# Patient Record
Sex: Female | Born: 1955 | Race: White | Hispanic: No | Marital: Married | Smoking: Never smoker
Health system: Southern US, Community
[De-identification: ages and names within clinical notes are randomized; demographics above are authoritative.]

## PROBLEM LIST (undated history)

## (undated) DIAGNOSIS — Z8669 Personal history of other diseases of the nervous system and sense organs: Secondary | ICD-10-CM

## (undated) DIAGNOSIS — R9431 Abnormal electrocardiogram [ECG] [EKG]: Secondary | ICD-10-CM

## (undated) DIAGNOSIS — R9430 Abnormal result of cardiovascular function study, unspecified: Secondary | ICD-10-CM

## (undated) DIAGNOSIS — R0989 Other specified symptoms and signs involving the circulatory and respiratory systems: Secondary | ICD-10-CM

## (undated) DIAGNOSIS — IMO0002 Reserved for concepts with insufficient information to code with codable children: Secondary | ICD-10-CM

## (undated) DIAGNOSIS — Z8619 Personal history of other infectious and parasitic diseases: Secondary | ICD-10-CM

## (undated) DIAGNOSIS — I4891 Unspecified atrial fibrillation: Secondary | ICD-10-CM

## (undated) DIAGNOSIS — I499 Cardiac arrhythmia, unspecified: Secondary | ICD-10-CM

## (undated) DIAGNOSIS — C439 Malignant melanoma of skin, unspecified: Secondary | ICD-10-CM

## (undated) DIAGNOSIS — D689 Coagulation defect, unspecified: Secondary | ICD-10-CM

## (undated) DIAGNOSIS — N390 Urinary tract infection, site not specified: Secondary | ICD-10-CM

## (undated) HISTORY — DX: Malignant melanoma of skin, unspecified: C43.9

## (undated) HISTORY — DX: Personal history of other diseases of the nervous system and sense organs: Z86.69

## (undated) HISTORY — DX: Personal history of other infectious and parasitic diseases: Z86.19

## (undated) HISTORY — DX: Abnormal result of cardiovascular function study, unspecified: R94.30

## (undated) HISTORY — DX: Cardiac arrhythmia, unspecified: I49.9

## (undated) HISTORY — DX: Coagulation defect, unspecified: D68.9

## (undated) HISTORY — DX: Abnormal electrocardiogram (ECG) (EKG): R94.31

## (undated) HISTORY — DX: Reserved for concepts with insufficient information to code with codable children: IMO0002

## (undated) HISTORY — DX: Urinary tract infection, site not specified: N39.0

## (undated) HISTORY — DX: Unspecified atrial fibrillation: I48.91

## (undated) HISTORY — DX: Other specified symptoms and signs involving the circulatory and respiratory systems: R09.89

---

## 2010-12-23 HISTORY — PX: MELANOMA EXCISION: SHX5266

## 2011-04-23 LAB — HM PAP SMEAR: HM Pap smear: NORMAL

## 2012-05-05 ENCOUNTER — Encounter: Payer: Self-pay | Admitting: Family

## 2012-05-05 ENCOUNTER — Ambulatory Visit (INDEPENDENT_AMBULATORY_CARE_PROVIDER_SITE_OTHER): Payer: BC Managed Care – PPO | Admitting: Family

## 2012-05-05 VITALS — BP 130/96 | HR 74 | Temp 97.4°F | Resp 16 | Ht 68.5 in | Wt 137.0 lb

## 2012-05-05 DIAGNOSIS — Z85820 Personal history of malignant melanoma of skin: Secondary | ICD-10-CM

## 2012-05-05 DIAGNOSIS — R300 Dysuria: Secondary | ICD-10-CM

## 2012-05-05 DIAGNOSIS — N390 Urinary tract infection, site not specified: Secondary | ICD-10-CM | POA: Insufficient documentation

## 2012-05-05 DIAGNOSIS — M949 Disorder of cartilage, unspecified: Secondary | ICD-10-CM

## 2012-05-05 DIAGNOSIS — M899 Disorder of bone, unspecified: Secondary | ICD-10-CM

## 2012-05-05 DIAGNOSIS — M8580 Other specified disorders of bone density and structure, unspecified site: Secondary | ICD-10-CM | POA: Insufficient documentation

## 2012-05-05 DIAGNOSIS — Z86006 Personal history of melanoma in-situ: Secondary | ICD-10-CM | POA: Insufficient documentation

## 2012-05-05 LAB — POCT URINALYSIS DIPSTICK
Bilirubin, UA: NEGATIVE
Glucose, UA: NEGATIVE
Ketones, UA: NEGATIVE
Spec Grav, UA: 1.015
Urobilinogen, UA: 0.2

## 2012-05-05 MED ORDER — CIPROFLOXACIN HCL 500 MG PO TABS: 500.0000 mg | ORAL_TABLET | Freq: Two times a day (BID) | ORAL | Status: DC

## 2012-05-05 NOTE — Assessment & Plan Note (Signed)
UA is reviewed and is + for blood and leukocytes suggesting UTI. Will send urine for culture and will plan to treat with Cipro.

## 2012-05-05 NOTE — Assessment & Plan Note (Signed)
Will request old records including dexa.  Continue caltrate/vitamin D, exercise.

## 2012-05-05 NOTE — Patient Instructions (Signed)
Please schedule fasting physical at the front desk. Call if your urinary symptoms worsen or if no improvement in 2-3 days.

## 2012-05-05 NOTE — Assessment & Plan Note (Signed)
She is followed by dermatology (Draelos).

## 2012-05-05 NOTE — Progress Notes (Signed)
Subjective:    Patient ID: Debra Murphy, female    DOB: June 14, 1956, 56 y.o.   MRN: [id number]  HPI  Debra Murphy is a 56 yr old female who presents today to establish care.  She presents with chief complaint of dysuria.  1) Dysuria- Symptoms started Sunday night.  Denies fever.  Small blood clot this AM.  Energy feels fine.   2) Melanoma- Spot removed last year 2012 (in situ) per pt.  Sees Zoe Draelos.  She sees her every 6 months.   3) Osteopenia- she reports last dexa was 5/12 and noted osteopenia.  She is maintained on caltrate and vitamin D.   Review of Systems  Constitutional: Negative for unexpected weight change.  HENT: Negative for hearing loss.   Eyes: Negative for visual disturbance.  Respiratory: Negative for shortness of breath.   Cardiovascular: Negative for chest pain and leg swelling.  Gastrointestinal: Negative for nausea, vomiting and diarrhea.  Genitourinary: Positive for dysuria and hematuria.  Musculoskeletal: Negative for myalgias and arthralgias.  Skin: Negative for pallor.  Neurological: Negative for headaches.  Hematological: Negative for adenopathy.  Psychiatric/Behavioral:       Denies depression/anxiety   Past Medical History  Diagnosis Date  . History of chicken pox   . History of migraine     rare migraines    History   Social History  . Marital Status: Married    Spouse Name: N/A    Number of Children: 2  . Years of Education: N/A   Occupational History  . Not on file.   Social History Main Topics  . Smoking status: Never Smoker   . Smokeless tobacco: Never Used  . Alcohol Use: Yes     social  . Drug Use: Not on file  . Sexually Active: Not on file   Other Topics Concern  . Not on file   Social History Narrative   Regular exercise:  Walks 3 x weeklyNon smokerLooking for work- worked at [employer] over [employer].  Volunteers at [employer] theater and [employer]oreComplete collegeMarried for 58 yrs2 daughters- one in [place] and [place]- no  grandchildrenExtended family on 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      Past Surgical History  Procedure Date  . Melanoma excision 2012    melanoma remove from neck    Family History  Problem Relation Age of Onset  . Arthritis Mother   . Heart disease Mother     valve replacement  . Cancer Father     lung, bone, died at 15  . Heart attack Father     in his 57's  . Diabetes Neg Hx   . Stroke Neg Hx     No Known Allergies  Current Outpatient Prescriptions on File Prior to Visit  Medication Sig Dispense Refill  . Calcium Carb-Cholecalciferol (CALCIUM 1000 + D PO) Take 1 tablet by mouth 2 (two) times daily.        BP 130/96  Pulse 74  Temp(Src) 97.4 F (36.3 C) (Oral)  Resp 16  Ht 5' 8.5" (1.74 m)  Wt 137 lb (62.143 kg)  BMI 20.53 kg/m2  SpO2 94%  LMP 10/05/2009       Objective:   Physical Exam  Constitutional: She is oriented to person, place, and time. She appears well-developed and well-nourished. No distress.  HENT:  Head: Normocephalic.  Right Ear: Tympanic membrane and ear canal normal.  Left Ear: Tympanic membrane and ear canal normal.  Mouth/Throat: No posterior oropharyngeal edema or posterior oropharyngeal erythema.  Cardiovascular: Normal rate and regular rhythm.   No murmur heard. Pulmonary/Chest: Effort normal and breath sounds normal. No respiratory distress. She has no wheezes. She has no rales. She exhibits no tenderness.  Abdominal: Soft. Bowel sounds are normal. She exhibits no distension.  Genitourinary:       Neg CVAT  Musculoskeletal: She exhibits no edema.  Neurological: She is alert and oriented to person, place, and time.  Skin: Skin is warm and dry.  Psychiatric: She has a normal mood and affect. Her behavior is normal. Judgment and thought content normal.          Assessment & Plan:

## 2012-05-08 LAB — URINE CULTURE: Colony Count: 35000

## 2012-05-13 ENCOUNTER — Encounter: Payer: Self-pay | Admitting: Cardiology

## 2012-05-13 ENCOUNTER — Other Ambulatory Visit: Payer: Self-pay | Admitting: Family

## 2012-05-13 ENCOUNTER — Other Ambulatory Visit (HOSPITAL_COMMUNITY)
Admission: RE | Admit: 2012-05-13 | Discharge: 2012-05-13 | Disposition: A | Discharge status: Home / Self Care | Payer: BC Managed Care – PPO | Source: Ambulatory Visit | Attending: Family | Admitting: Family

## 2012-05-13 ENCOUNTER — Ambulatory Visit (INDEPENDENT_AMBULATORY_CARE_PROVIDER_SITE_OTHER): Payer: BC Managed Care – PPO | Admitting: Cardiology

## 2012-05-13 ENCOUNTER — Encounter: Payer: Self-pay | Admitting: Family

## 2012-05-13 ENCOUNTER — Ambulatory Visit (INDEPENDENT_AMBULATORY_CARE_PROVIDER_SITE_OTHER): Payer: BC Managed Care – PPO | Admitting: Family

## 2012-05-13 ENCOUNTER — Encounter: Payer: Self-pay | Admitting: Gastroenterology

## 2012-05-13 VITALS — BP 120/90 | HR 84 | Ht 69.0 in | Wt 136.1 lb

## 2012-05-13 DIAGNOSIS — Z01419 Encounter for gynecological examination (general) (routine) without abnormal findings: Secondary | ICD-10-CM | POA: Insufficient documentation

## 2012-05-13 DIAGNOSIS — I4891 Unspecified atrial fibrillation: Secondary | ICD-10-CM

## 2012-05-13 DIAGNOSIS — Z1231 Encounter for screening mammogram for malignant neoplasm of breast: Secondary | ICD-10-CM

## 2012-05-13 DIAGNOSIS — R9431 Abnormal electrocardiogram [ECG] [EKG]: Secondary | ICD-10-CM | POA: Insufficient documentation

## 2012-05-13 DIAGNOSIS — Z0000 Encounter for general adult medical examination without abnormal findings: Secondary | ICD-10-CM | POA: Insufficient documentation

## 2012-05-13 DIAGNOSIS — IMO0002 Reserved for concepts with insufficient information to code with codable children: Secondary | ICD-10-CM

## 2012-05-13 LAB — CBC WITH DIFFERENTIAL/PLATELET
Eosinophils Absolute: 0.2 10*3/uL (ref 0.0–0.7)
Hemoglobin: 13.6 g/dL (ref 12.0–15.0)
Lymphs Abs: 1.6 10*3/uL (ref 0.7–4.0)
MCH: 30.6 pg (ref 26.0–34.0)
Monocytes Relative: 6 % (ref 3–12)
Neutro Abs: 3.5 10*3/uL (ref 1.7–7.7)
Neutrophils Relative %: 61 % (ref 43–77)
Platelets: 162 10*3/uL (ref 150–400)
RBC: 4.44 MIL/uL (ref 3.87–5.11)
WBC: 5.7 10*3/uL (ref 4.0–10.5)

## 2012-05-13 LAB — LIPID PANEL
Cholesterol: 166 mg/dL (ref 0–200)
LDL Cholesterol: 85 mg/dL (ref 0–99)
Triglycerides: 71 mg/dL (ref <150)

## 2012-05-13 LAB — BASIC METABOLIC PANEL WITH GFR
CO2: 30 mEq/L (ref 19–32)
Calcium: 9.6 mg/dL (ref 8.4–10.5)
GFR, Est African American: >89 mL/min
Glucose, Bld: 82 mg/dL (ref 70–99)
Potassium: 4 mEq/L (ref 3.5–5.3)
Sodium: 146 mEq/L — ABNORMAL HIGH (ref 135–145)

## 2012-05-13 LAB — HEPATIC FUNCTION PANEL
ALT: 27 U/L (ref 0–35)
Alkaline Phosphatase: 116 U/L (ref 39–117)
Bilirubin, Direct: 0.2 mg/dL (ref 0.0–0.3)
Indirect Bilirubin: 0.5 mg/dL (ref 0.0–0.9)
Total Protein: 6.9 g/dL (ref 6.0–8.3)

## 2012-05-13 MED ORDER — ASPIRIN EC 81 MG PO TBEC: 81.0000 mg | DELAYED_RELEASE_TABLET | Freq: Every day | ORAL | Status: AC

## 2012-05-13 MED ORDER — DILTIAZEM HCL ER COATED BEADS 120 MG PO TB24: 120.0000 mg | ORAL_TABLET | Freq: Every day | ORAL | Status: DC

## 2012-05-13 NOTE — Assessment & Plan Note (Signed)
New, asymptomatic.  Rate 116 on EKG.  Case reviewed with Dr. Rodena Medin.  Will start cardizem for rate control (per pharm request rx changed from cardizem LA to CD due to lower co-pay).  Add aspirin, refer for cardiology for further evaluation.  Defer anticoagulation to cardiology.

## 2012-05-13 NOTE — Progress Notes (Signed)
Subjective:    Patient ID: Debra Murphy, female    DOB: [birth year], 56 y.o.   MRN: [id number]  HPI  Ms.  Murphy is a 56 yr old female who presents today for CPX.   Last mammogram 2011, last pap smear and DEXA 04/2011. Last tetanus 2009. Never had colonoscopy. Diet is healthy.  Regular exercise.  Tetanus is up to date.  Walking with neighbor.   Review of Systems  Constitutional: Negative for unexpected weight change.  HENT: Negative for hearing loss and congestion.   Eyes: Negative for visual disturbance.  Respiratory: Negative for cough.   Cardiovascular: Negative for chest pain.  Gastrointestinal: Negative for nausea, vomiting and diarrhea.  Genitourinary: Negative for dysuria, frequency and hematuria.  Musculoskeletal: Negative for myalgias and arthralgias.  Skin: Negative for rash.  Neurological: Negative for headaches.  Hematological: Negative for adenopathy.  Psychiatric/Behavioral:       Reports good mood.     Past Medical History  Diagnosis Date  . History of chicken pox   . History of migraine     rare migraines    History   Social History  . Marital Status: Married    Spouse Name: Debra Murphy    Number of Children: 2  . Years of Education: [place]bra [place]   Occupational History  . Not on file.   Social History Main Topics  . Smoking status: Never Smoker   . Smokeless tobacco: Never Used  . Alcohol Use: Yes     social  . Drug Use: Not on file  . Sexually Active: Not on file   Other Topics Concern  . Not on file   Social History Narrative   Regular exercise:  Walks 3 x weeklyNon smokerLooking for work- worked at [employer] over [employer].  Volunteers at [employer] theater and [employer]Complete collegeMarried for 58 yrs2 daughters- one in [place] and 1 in [place]- no grandchildrenExtended family on 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      Past Surgical History  Procedure Date  . Melanoma excision 2012    melanoma remove from neck    Family  History  Problem Relation Age of Onset  . Arthritis Mother   . Heart disease Mother     valve replacement  . Cancer Father     lung, bone, died at 4  . Heart attack Father     in his 100's  . Diabetes Neg Hx   . Stroke Neg Hx     No Known Allergies  Current Outpatient Prescriptions on File Prior to Visit  Medication Sig Dispense Refill  . Calcium Carb-Cholecalciferol (CALCIUM 1000 + D PO) Take 1 tablet by mouth 2 (two) times daily.      . Cholecalciferol (VITAMIN D3 PO) Take 600 mg by mouth daily.      . Multiple Vitamins-Minerals (PRESERVISION AREDS 2) CAPS Take 2 capsules by mouth 2 (two) times daily.      . ciprofloxacin (CIPRO) 500 MG tablet Take 1 tablet (500 mg total) by mouth 2 (two) times daily.  10 tablet  0  . diltiazem (CARDIZEM LA) 120 MG 24 hr tablet Take 1 tablet (120 mg total) by mouth daily.  30 tablet  0    BP 128/90  Pulse 96  Temp(Src) 97.8 F (36.6 C) (Oral)  Resp 16  Ht 5' 8.5" (1.74 m)  Wt 137 lb 1.3 oz (62.179 kg)  BMI 20.54 kg/m2  SpO2 97%  LMP 10/05/2009  Objective:   Physical Exam   Physical Exam  Constitutional: She is oriented to person, place, and time. She appears well-developed and well-nourished. No distress.  HENT:  Head: Normocephalic and atraumatic.  Right Ear: Tympanic membrane and ear canal normal.  Left Ear: Tympanic membrane and ear canal normal.  Mouth/Throat: Oropharynx is clear and moist.  Eyes: Pupils are equal, round, and reactive to light. No scleral icterus.  Neck: Normal range of motion. No thyromegaly present.  Cardiovascular: irregular rate/rythm   No murmur heard. Pulmonary/Chest: Effort normal and breath sounds normal. No respiratory distress. He has no wheezes. She has no rales. She exhibits no tenderness.  Abdominal: Soft. Bowel sounds are normal. He exhibits no distension and no mass. There is no tenderness. There is no rebound and no guarding.  Musculoskeletal: She exhibits no edema.  Lymphadenopathy:      She has no cervical adenopathy.  Neurological: She is alert and oriented to person, place, and time. She has normal reflexes. She exhibits normal muscle tone. Coordination normal.  Skin: Skin is warm and dry.  Psychiatric: She has a normal mood and affect. Her behavior is normal. Judgment and thought content normal.  Breasts: Examined lying Right: Without masses, retractions, discharge or axillary adenopathy.  Left: Without masses, retractions, discharge or axillary adenopathy.  Inguinal/mons: Normal without inguinal adenopathy  External genitalia: Normal  BUS/Urethra/Skene's glands: Normal  Bladder: Normal  Vagina: Normal  Cervix: Normal (pap performed with chaperone) Uterus: normal in size, shape and contour. Midline and mobile  Adnexa/parametria:  Rt: Without masses or tenderness.  Lt: Without masses or tenderness.  Anus and perineum: Normal           Assessment & Plan:        Assessment & Plan:

## 2012-05-13 NOTE — Assessment & Plan Note (Signed)
Patient encouraged to continue healthy diet, exercise.  Immunizations up to date.  Pap performed today.  Obtain fasting lab work.  Refer for colonoscopy, mammogram.

## 2012-05-13 NOTE — Assessment & Plan Note (Signed)
This is a new diagnosis of atrial fibrillation for this patient. She does not have any marked symptoms. However her resting rate is mildly increased. She does not remember having some palpitations. Cardizem is being added for rate control. Labs were sent from the primary care office today. We need to know what her thyroid function is. Two-dimensional echo will be done to assess her LV function and valvular function. I will then see her back to decide if we want to try to anticoagulate and proceed with cardioversion. At this point her CHADS score is very low. She does not need to be anticoagulated as of today.

## 2012-05-13 NOTE — Patient Instructions (Signed)
Your physician recommends that you schedule a follow-up appointment in: 2 to 3 weeks with Dr. Myrtis Ser. Your physician has requested that you have an echocardiogram. Echocardiography is a painless test that uses sound waves to create images of your heart. It provides your doctor with information about the size and shape of your heart and how well your heart's chambers and valves are working. This procedure takes approximately one hour. There are no restrictions for this procedure.

## 2012-05-13 NOTE — Progress Notes (Signed)
HPI Patient was being seen for general evaluation by the primary care team today. It was noted that she had atrial fibrillation on EKG. The patient was not aware of this diagnosis. Looking back she does remember feeling some palpitations. She has not been limited by this. She has not had syncope or presyncope. There is no chest pain.  I was called and asked to have the patient on for cardiology evaluation today.  No Known Allergies  Current Outpatient Prescriptions  Medication Sig Dispense Refill  . Calcium Carb-Cholecalciferol (CALCIUM 1000 + D PO) Take 1 tablet by mouth 2 (two) times daily.      . Cholecalciferol (VITAMIN D3 PO) Take 600 mg by mouth daily.      . Multiple Vitamins-Minerals (PRESERVISION AREDS 2) CAPS Take 2 capsules by mouth 2 (two) times daily.      Marland Kitchen aspirin EC 81 MG tablet Take 1 tablet (81 mg total) by mouth daily.      Marland Kitchen diltiazem (CARDIZEM CD) 120 MG 24 hr capsule Take 120 mg by mouth daily.        History   Social History  . Marital Status: Married    Spouse Name: N/A    Number of Children: 2  . Years of Education: N/A   Occupational History  . Not on file.   Social History Main Topics  . Smoking status: Never Smoker   . Smokeless tobacco: Never Used  . Alcohol Use: Yes     social  . Drug Use: Not on file  . Sexually Active: Not on file   Other Topics Concern  . Not on file   Social History Narrative   Regular exercise:  Walks 3 x weeklyNon smokerLooking for work- worked at [employer] over [employer].  Volunteers at [employer] theater and [employer]Complete collegeMarried for 107 yrs2 daughters- one in [place] and 1 in [place]- no grandchildrenExtended family on 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      Family History  Problem Relation Age of Onset  . Arthritis Mother   . Heart disease Mother     valve replacement  . Cancer Father     lung, bone, died at 26  . Heart attack Father     in his 39's  . Diabetes Neg  Hx   . Stroke Neg Hx     Past Medical History  Diagnosis Date  . History of chicken pox   . History of migraine     rare migraines  . Melanoma     Melanoma removed from the neck  . Atrial fibrillation     New diagnosis, asymptomatic, may 22nd, 2013    Past Surgical History  Procedure Date  . Melanoma excision 2012    melanoma remove from neck    ROS Patient denies fever, chills, headache, sweats, rash, change in vision, change in hearing, chest pain, cough, nausea vomiting, urinary symptoms. All other systems are reviewed and are negative.  PHYSICAL EXAM Patient is stable. She is oriented to person time and place. Affect is normal. There no carotid bruits. There is no jugulovenous distention. Lungs are clear. Respiratory effort is nonlabored. Cardiac exam reveals an S1 and S2. The rhythm is irregularly irregular. The rate is controlled. There no significant murmurs. Abdomen is soft. There is no peripheral edema. There no musculoskeletal deformities. There are no skin rashes. There is a small scar on the neck from where her melanoma was removed.  Filed Vitals:  05/13/12 1041  BP: 120/90  Pulse: 84  Height: 5\' 9"  (1.753 m)  Weight: 136 lb 1.9 oz (61.744 kg)   I have reviewed the EKG done earlier today in the primary care office. There is atrial fibrillation. The rate is controlled. There is decreased anterior R-wave progression. Possible old anterior infarction cannot be ruled out.  ASSESSMENT & PLAN

## 2012-05-13 NOTE — Patient Instructions (Addendum)
Please complete your lab work prior to leaving.  You will be contact about your referral to cardiology and colonoscopy. Please let us know if you have not heard back within 1 week about your referral. Schedule your mammogram on the first floor.

## 2012-05-13 NOTE — Assessment & Plan Note (Signed)
There is decrease anterior R wave progression. Old anteroseptal infarct cannot be ruled out. We will gather more information concerning this from her echo also. So far there is no proof that she has coronary disease.

## 2012-05-13 NOTE — Progress Notes (Signed)
Addended by: Mervin Kung A on: 05/13/2012 09:29 AM   Modules accepted: Orders

## 2012-05-14 LAB — URINALYSIS, ROUTINE W REFLEX MICROSCOPIC
Bilirubin Urine: NEGATIVE
Glucose, UA: NEGATIVE mg/dL
Leukocytes, UA: NEGATIVE
Protein, ur: NEGATIVE mg/dL
pH: 7.5 (ref 5.0–8.0)

## 2012-05-19 ENCOUNTER — Other Ambulatory Visit: Payer: Self-pay

## 2012-05-19 ENCOUNTER — Ambulatory Visit (HOSPITAL_COMMUNITY): Payer: BC Managed Care – PPO | Attending: Cardiology

## 2012-05-19 ENCOUNTER — Encounter: Payer: Self-pay | Admitting: Family

## 2012-05-19 DIAGNOSIS — I4891 Unspecified atrial fibrillation: Secondary | ICD-10-CM | POA: Insufficient documentation

## 2012-05-19 DIAGNOSIS — R002 Palpitations: Secondary | ICD-10-CM | POA: Insufficient documentation

## 2012-05-20 ENCOUNTER — Ambulatory Visit (HOSPITAL_BASED_OUTPATIENT_CLINIC_OR_DEPARTMENT_OTHER)
Admission: RE | Admit: 2012-05-20 | Discharge: 2012-05-20 | Disposition: A | Discharge status: Home / Self Care | Payer: BC Managed Care – PPO | Source: Ambulatory Visit | Attending: Family | Admitting: Family

## 2012-05-20 ENCOUNTER — Telehealth: Payer: Self-pay | Admitting: Family

## 2012-05-20 ENCOUNTER — Telehealth: Payer: Self-pay | Admitting: Cardiology

## 2012-05-20 ENCOUNTER — Inpatient Hospital Stay (HOSPITAL_BASED_OUTPATIENT_CLINIC_OR_DEPARTMENT_OTHER): Admission: RE | Admit: 2012-05-20 | Payer: BC Managed Care – PPO | Source: Ambulatory Visit

## 2012-05-20 DIAGNOSIS — Z1231 Encounter for screening mammogram for malignant neoplasm of breast: Secondary | ICD-10-CM | POA: Insufficient documentation

## 2012-05-20 NOTE — Telephone Encounter (Signed)
Received medical records from [facility]-Dr. Sammuel Cooper

## 2012-05-20 NOTE — Progress Notes (Signed)
N/A.  LMTC. 

## 2012-06-03 ENCOUNTER — Ambulatory Visit (AMBULATORY_SURGERY_CENTER): Payer: BC Managed Care – PPO | Admitting: *Deleted

## 2012-06-03 ENCOUNTER — Ambulatory Visit: Payer: BC Managed Care – PPO | Admitting: Cardiology

## 2012-06-03 ENCOUNTER — Encounter: Payer: Self-pay | Admitting: Gastroenterology

## 2012-06-03 ENCOUNTER — Telehealth: Payer: Self-pay | Admitting: Gastroenterology

## 2012-06-03 VITALS — Ht 69.0 in | Wt 135.6 lb

## 2012-06-03 DIAGNOSIS — Z1211 Encounter for screening for malignant neoplasm of colon: Secondary | ICD-10-CM

## 2012-06-03 MED ORDER — MOVIPREP 100 G PO SOLR: ORAL | Status: DC

## 2012-06-03 NOTE — Telephone Encounter (Signed)
Can  Not switch to go-lytel

## 2012-06-05 ENCOUNTER — Encounter: Payer: Self-pay | Admitting: Cardiology

## 2012-06-05 DIAGNOSIS — R9430 Abnormal result of cardiovascular function study, unspecified: Secondary | ICD-10-CM | POA: Insufficient documentation

## 2012-06-08 ENCOUNTER — Ambulatory Visit (INDEPENDENT_AMBULATORY_CARE_PROVIDER_SITE_OTHER): Payer: BC Managed Care – PPO | Admitting: Cardiology

## 2012-06-08 ENCOUNTER — Encounter: Payer: Self-pay | Admitting: Cardiology

## 2012-06-08 VITALS — BP 104/60 | HR 72 | Resp 17 | Ht 69.0 in | Wt 135.1 lb

## 2012-06-08 DIAGNOSIS — I4891 Unspecified atrial fibrillation: Secondary | ICD-10-CM

## 2012-06-08 DIAGNOSIS — R9431 Abnormal electrocardiogram [ECG] [EKG]: Secondary | ICD-10-CM

## 2012-06-08 MED ORDER — DILTIAZEM HCL ER COATED BEADS 120 MG PO CP24: 120.0000 mg | ORAL_CAPSULE | Freq: Every day | ORAL | Status: DC

## 2012-06-08 NOTE — Assessment & Plan Note (Signed)
The patient has decreased anterior R-wave progression on her EKG. The echo shows normal left ventricular function. No further workup is needed.

## 2012-06-08 NOTE — Patient Instructions (Addendum)
Your physician recommends that you continue on your current medications as directed. Please refer to the Current Medication list given to you today.  Your physician recommends that you schedule a follow-up appointment in: 3 months  

## 2012-06-08 NOTE — Progress Notes (Signed)
HPI  Patient is seen to followup atrial fibrillation. I saw her as a new patient on May 13, 2012. Atrial fibrillation was diagnosed incidentally earlier that day on EKG with primary care. She was not aware of any  Significant problems. She had some rare palpitations over time. TSH was checked and was normal. Two-dimensional echo was done. Ejection fraction is 55-65%. There were no wall motion abnormalities. There were no valvular abnormalities. I put her at that time on a small dose of diltiazem. She tolerates the medicine without any difficulties. She is here today for a return visit to review the studies and decide the next step.  No Known Allergies  Current Outpatient Prescriptions  Medication Sig Dispense Refill  . aspirin EC 81 MG tablet Take 1 tablet (81 mg total) by mouth daily.      . Calcium Carb-Cholecalciferol (CALCIUM 1000 + D PO) Take 1 tablet by mouth 2 (two) times daily.      . Cholecalciferol (VITAMIN D3 PO) Take 600 mg by mouth daily.      Marland Kitchen diltiazem (CARDIZEM CD) 120 MG 24 hr capsule Take 120 mg by mouth daily.      Marland Kitchen MOVIPREP 100 G SOLR movi prep as directed  1 each  0  . Multiple Vitamins-Minerals (PRESERVISION AREDS 2) CAPS Take 2 capsules by mouth 2 (two) times daily.        History   Social History  . Marital Status: Married    Spouse Name: N/A    Number of Children: 2  . Years of Education: N/A   Occupational History  . Not on file.   Social History Main Topics  . Smoking status: Never Smoker   . Smokeless tobacco: Never Used  . Alcohol Use: Yes     social  . Drug Use: Not on file  . Sexually Active: Not on file   Other Topics Concern  . Not on file   Social History Narrative   Regular exercise:  Walks 3 x weeklyNon smokerLooking for work- worked at [employer] over [employer].  Volunteers at [employer] and [employer]Complete collegeMarried for 60 yrs2 daughters- one in [place] and 1 in [place]- no grandchildrenExtended family on 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      Family History  Problem Relation Age of Onset  . Arthritis Mother   . Heart disease Mother     valve replacement  . Cancer Father     lung, bone, died at 31  . Heart attack Father     in his 64's  . Diabetes Neg Hx   . Stroke Neg Hx     Past Medical History  Diagnosis Date  . History of chicken pox   . History of migraine     rare migraines  . Melanoma     Melanoma removed from the neck  . Atrial fibrillation     New diagnosis, asymptomatic, may 22nd, 2013  . Abnormal EKG     May 13, 2012  . Ejection fraction     EF 55-65%, echo, May 19, 2012    Past Surgical History  Procedure Date  . Melanoma excision 2012    melanoma remove from neck    ROS  Patient denies fever, chills, headache, sweats, rash, change in vision, change in hearing, chest pain, cough, nausea vomiting, urinary symptoms. All other systems are reviewed and are negative.  PHYSICAL EXAM  Patient is oriented to person time and place.  Affect is normal. There is no jugulovenous distention. Lungs are clear. Respiratory effort is nonlabored. Cardiac exam reveals S1 and S2. There no clicks or significant murmurs. The abdomen is soft. There's no peripheral edema. Filed Vitals:   06/08/12 1507  BP: 104/60  Pulse: 72  Resp: 17  Height: 5\' 9"  (1.753 m)  Weight: 135 lb 1.9 oz (61.29 kg)   EKG is done today. She has spontaneously converted to sinus rhythm. ASSESSMENT & PLAN

## 2012-06-08 NOTE — Assessment & Plan Note (Signed)
The patient has spontaneously converted to sinus rhythm. She does not have any marked palpitations. She's tolerating a low dose of diltiazem. Her CHADS score is 0. She does not need to be anticoagulated. I have talked to her about no excess caffeine. In general this is not a problem. Her thyroid functions are normal. LV function is normal. She will go about full activities. She'll remain on a small dose of diltiazem. I'll see her back in 3 months for followup.

## 2012-06-17 ENCOUNTER — Encounter: Payer: Self-pay | Admitting: Gastroenterology

## 2012-06-17 ENCOUNTER — Ambulatory Visit (AMBULATORY_SURGERY_CENTER): Payer: BC Managed Care – PPO | Admitting: Gastroenterology

## 2012-06-17 VITALS — BP 125/77 | HR 71 | Temp 97.5°F | Resp 20 | Ht 69.0 in | Wt 135.0 lb

## 2012-06-17 DIAGNOSIS — Z1211 Encounter for screening for malignant neoplasm of colon: Secondary | ICD-10-CM

## 2012-06-17 MED ORDER — SODIUM CHLORIDE 0.9 % IV SOLN: 500.0000 mL | INTRAVENOUS | Status: DC

## 2012-06-17 NOTE — Progress Notes (Signed)
Patient did not experience any of the following events: a burn prior to discharge; a fall within the facility; wrong site/side/patient/procedure/implant event; or a hospital transfer or hospital admission upon discharge from the facility. (G8907) Patient did not have preoperative order for IV antibiotic SSI prophylaxis. (G8918)  

## 2012-06-17 NOTE — Op Note (Signed)
[facility] [address] [place], [place]  [address]  COLONOSCOPY PROCEDURE REPORT  PATIENT:  Debra, Murphy  MR#:  [id number] BIRTHDATE:  1956/01/14, 56 yrs. old  GENDER:  female ENDOSCOPIST:  Vania Rea. Jarold Motto, MD, [facility] REF. BY:  Sandford Craze, FNP PROCEDURE DATE:  06/17/2012 PROCEDURE:  Average-risk screening colonoscopy G0121 ASA CLASS:  Class II INDICATIONS:  Routine Risk Screening MEDICATIONS:   propofol (Diprivan) 100 mg IV  DESCRIPTION OF PROCEDURE:   After the risks and benefits and of the procedure were explained, informed consent was obtained. Digital rectal exam was performed and revealed no abnormalities. The LB [id number]-[id number] endoscope was introduced through the anus and advanced to the cecum, which was identified by both the appendix and ileocecal valve.  The quality of the prep was excellent, using MoviPrep.  The instrument was then slowly withdrawn as the colon was fully examined. <<PROCEDUREIMAGES>>  FINDINGS:  No polyps or cancers were seen.  This was otherwise a normal examination of the colon.   Retroflexed views in the rectum revealed no abnormalities.    The scope was then withdrawn from the patient and the procedure completed.  COMPLICATIONS:  None ENDOSCOPIC IMPRESSION: 1) No polyps or cancers 2) Otherwise normal examination RECOMMENDATIONS: 1) Continue current colorectal screening recommendations for "routine risk" patients with a repeat colonoscopy in 10 years.  REPEAT EXAM:  No  ______________________________ Vania Rea. Jarold Motto, MD, [facility]  CC:  n. eSIGNED:   Vania Rea. Braxtyn Bojarski at 06/17/2012 09:12 AM  Georgiann Mohs, [id number]

## 2012-06-17 NOTE — Patient Instructions (Addendum)
Discharge instructions given with verbal understanding. Normal exam. Resume previous medications. YOU HAD AN ENDOSCOPIC PROCEDURE TODAY AT THE [facility]: Refer to the procedure report that was given to you for any specific questions about what was found during the examination.  If the procedure report does not answer your questions, please call your gastroenterologist to clarify.  If you requested that your care partner not be given the details of your procedure findings, then the procedure report has been included in a sealed envelope for you to review at your convenience later.  YOU SHOULD EXPECT: Some feelings of bloating in the abdomen. Passage of more gas than usual.  Walking can help get rid of the air that was put into your GI tract during the procedure and reduce the bloating. If you had a lower endoscopy (such as a colonoscopy or flexible sigmoidoscopy) you may notice spotting of blood in your stool or on the toilet paper. If you underwent a bowel prep for your procedure, then you may not have a normal bowel movement for a few days.  DIET: Your first meal following the procedure should be a light meal and then it is ok to progress to your normal diet.  A half-sandwich or bowl of soup is an example of a good first meal.  Heavy or fried foods are harder to digest and may make you feel nauseous or bloated.  Likewise meals heavy in dairy and vegetables can cause extra gas to form and this can also increase the bloating.  Drink plenty of fluids but you should avoid alcoholic beverages for 24 hours.  ACTIVITY: Your care partner should take you home directly after the procedure.  You should plan to take it easy, moving slowly for the rest of the day.  You can resume normal activity the day after the procedure however you should NOT DRIVE or use heavy machinery for 24 hours (because of the sedation medicines used during the test).    SYMPTOMS TO REPORT IMMEDIATELY: A gastroenterologist  can be reached at any hour.  During normal business hours, 8:30 AM to 5:00 PM Monday through Friday, call [phone].  After hours and on weekends, please call the GI answering service at [phone] who will take a message and have the physician on call contact you.   Following lower endoscopy (colonoscopy or flexible sigmoidoscopy):  Excessive amounts of blood in the stool  Significant tenderness or worsening of abdominal pains  Swelling of the abdomen that is new, acute  Fever of 100F or higher  FOLLOW UP: If any biopsies were taken you will be contacted by phone or by letter within the next 1-3 weeks.  Call your gastroenterologist if you have not heard about the biopsies in 3 weeks.  Our staff will call the home number listed on your records the next business day following your procedure to check on you and address any questions or concerns that you may have at that time regarding the information given to you following your procedure. This is a courtesy call and so if there is no answer at the home number and we have not heard from you through the emergency physician on call, we will assume that you have returned to your regular daily activities without incident.  SIGNATURES/CONFIDENTIALITY: You and/or your care partner have signed paperwork which will be entered into your electronic medical record.  These signatures attest to the fact that that the information above on your After Visit Summary has been reviewed   and is understood.  Full responsibility of the confidentiality of this discharge information lies with you and/or your care-partner. 

## 2012-06-18 ENCOUNTER — Telehealth: Payer: Self-pay | Admitting: *Deleted

## 2012-06-18 NOTE — Telephone Encounter (Signed)
  Follow up Call-  Call back number 06/17/2012  Post procedure Call Back phone  # [phone]  Permission to leave phone message Yes     Patient questions:  Do you have a fever, pain , or abdominal swelling? no Pain Score  0 *  Have you tolerated food without any problems? yes  Have you been able to return to your normal activities? yes  Do you have any questions about your discharge instructions: Diet   no Medications  no Follow up visit  no  Do you have questions or concerns about your Care? no  Actions: * If pain score is 4 or above:

## 2012-09-14 ENCOUNTER — Encounter: Payer: Self-pay | Admitting: Cardiology

## 2012-09-14 ENCOUNTER — Ambulatory Visit (INDEPENDENT_AMBULATORY_CARE_PROVIDER_SITE_OTHER): Payer: BC Managed Care – PPO | Admitting: Cardiology

## 2012-09-14 VITALS — BP 127/42 | HR 88 | Ht 69.0 in | Wt 134.1 lb

## 2012-09-14 DIAGNOSIS — R9431 Abnormal electrocardiogram [ECG] [EKG]: Secondary | ICD-10-CM

## 2012-09-14 DIAGNOSIS — I4891 Unspecified atrial fibrillation: Secondary | ICD-10-CM

## 2012-09-14 DIAGNOSIS — R0989 Other specified symptoms and signs involving the circulatory and respiratory systems: Secondary | ICD-10-CM

## 2012-09-14 NOTE — Assessment & Plan Note (Signed)
Clinically it sounds as if the patient had an episode of atrial fib lasting approximately one hour it occurred one week ago. It converted spontaneously to sinus. Her stroke risk is very low. She is not anticoagulated. Because she had only one additional episode I decided not to do any further testing. I have considered whether an event recorder would be helpful to capture all of her arrhythmias. I have encouraged her to remain on a small dose of long-acting diltiazem.

## 2012-09-14 NOTE — Patient Instructions (Addendum)
Your physician has requested that you have a carotid duplex. This test is an ultrasound of the carotid arteries in your neck. It looks at blood flow through these arteries that supply the brain with blood. Allow one hour for this exam. There are no restrictions or special instructions.  Your physician recommends that you continue on your current medications as directed. Please refer to the Current Medication list given to you today.  Your physician recommends that you schedule a follow-up appointment in: 3 months  

## 2012-09-14 NOTE — Progress Notes (Signed)
HPI  The patient is seen today to followup atrial fibrillation. I saw her first is a new patient in May, 2013. She had the diagnosis of atrial fibrillation made as an incidental finding in her primary care office. She had no symptoms but she had atrial fibrillation. TSH was normal. 2-D echo was normal. She converted spontaneously to sinus. She is done very well over time. One week ago however she did notice an episode of rapid palpitations and she thought it lasted approximately one hour. She did not feel particularly well with this but there was no chest pain syncope or presyncope.    No Known Allergies  Current Outpatient Prescriptions  Medication Sig Dispense Refill  . aspirin EC 81 MG tablet Take 1 tablet (81 mg total) by mouth daily.      . Calcium Carb-Cholecalciferol (CALCIUM 1000 + D PO) Take 1 tablet by mouth 2 (two) times daily.      . Cholecalciferol (VITAMIN D3 PO) Take 600 mg by mouth daily.      Marland Kitchen diltiazem (CARDIZEM CD) 120 MG 24 hr capsule Take 1 capsule (120 mg total) by mouth daily.  30 capsule  6  . Multiple Vitamins-Minerals (PRESERVISION AREDS 2) CAPS Take 2 capsules by mouth 2 (two) times daily.        History   Social History  . Marital Status: Married    Spouse Name: N/A    Number of Children: 2  . Years of Education: N/A   Occupational History  . Not on file.   Social History Main Topics  . Smoking status: Never Smoker   . Smokeless tobacco: Never Used  . Alcohol Use: Yes     social  . Drug Use: Not on file  . Sexually Active: Not on file   Other Topics Concern  . Not on file   Social History Narrative   Regular exercise:  Walks 3 x weeklyNon smokerLooking for work- worked at [employer] over [employer].  Volunteers at [employer] theater and [employer] RestoreComplete collegeMarried for 96 yrs2 daughters- one in [place] and [place]- no grandchildrenExtended family on 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      Family History    Problem Relation Age of Onset  . Arthritis Mother   . Heart disease Mother     valve replacement  . Cancer Father     lung, bone, died at 66  . Heart attack Father     in his 53's  . Diabetes Neg Hx   . Stroke Neg Hx     Past Medical History  Diagnosis Date  . History of chicken pox   . History of migraine     rare migraines  . Melanoma     Melanoma removed from the neck  . Atrial fibrillation     New diagnosis, asymptomatic, may 22nd, 2013  . Abnormal EKG     May 13, 2012  . Ejection fraction     EF 55-65%, echo, May 19, 2012    Past Surgical History  Procedure Date  . Melanoma excision 2012    melanoma remove from neck    ROS   Patient denies fever, chills, headache, sweats, rash, change in vision, change in hearing, chest pain, cough, nausea vomiting, urinary symptoms. All the systems are reviewed and are negative.  PHYSICAL EXAM  Patient is stable today. She is here with her husband. She is oriented to person time and place. Affect is normal.  There is no jugulovenous distention. There is a short but high pitched sound heard in the area of her right carotid. This may be a bruit. Lungs are clear. Respiratory effort is nonlabored. Cardiac exam reveals S1 and S2. There no clicks or significant murmurs. The abdomen is soft. Is no peripheral edema. There no musculoskeletal deformities. There are no skin rashes.  Filed Vitals:   09/14/12 1519  BP: 127/42  Pulse: 88  Height: 5\' 9"  (1.753 m)  Weight: 134 lb 1.9 oz (60.836 kg)  SpO2: 98%     ASSESSMENT & PLAN

## 2012-09-14 NOTE — Assessment & Plan Note (Signed)
This is a new finding today. It is a high pitched sound. However it is not long. Carotid Doppler will be done to be careful and complete. I explained this fully to her.

## 2012-09-14 NOTE — Assessment & Plan Note (Signed)
Patient has an abnormal EKG from the past. However her LV function is normal. No further workup.

## 2012-09-23 ENCOUNTER — Ambulatory Visit (INDEPENDENT_AMBULATORY_CARE_PROVIDER_SITE_OTHER): Payer: BC Managed Care – PPO | Admitting: Family

## 2012-09-23 ENCOUNTER — Encounter: Payer: Self-pay | Admitting: Family

## 2012-09-23 VITALS — BP 128/82 | HR 81 | Temp 98.4°F | Resp 16 | Wt 134.1 lb

## 2012-09-23 DIAGNOSIS — N390 Urinary tract infection, site not specified: Secondary | ICD-10-CM

## 2012-09-23 DIAGNOSIS — R319 Hematuria, unspecified: Secondary | ICD-10-CM

## 2012-09-23 LAB — POCT URINALYSIS DIPSTICK
Glucose, UA: NEGATIVE
Nitrite, UA: NEGATIVE
Urobilinogen, UA: 0.2

## 2012-09-23 MED ORDER — CIPROFLOXACIN HCL 500 MG PO TABS: 500.0000 mg | ORAL_TABLET | Freq: Two times a day (BID) | ORAL | Status: DC

## 2012-09-23 NOTE — Assessment & Plan Note (Signed)
Large blood mod leuks on urine dip today. Will send urine for culture. Treat with cipro.

## 2012-09-23 NOTE — Patient Instructions (Addendum)
Urinary Tract Infection  A urinary tract infection (UTI) is often caused by a germ (bacteria). A UTI is usually helped with medicine (antibiotics) that kills germs. Take all the medicine until it is gone. Do this even if you are feeling better. You are usually better in 7 to 10 days.  HOME CARE    Drink enough water and fluids to keep your pee (urine) clear or pale yellow. Drink:   Cranberry juice.   Water.   Avoid:   Caffeine.   Tea.   Bubbly (carbonated) drinks.   Alcohol.   Only take medicine as told by your doctor.   To prevent further infections:   Pee often.   After pooping (bowel movement), women should wipe from front to back. Use each tissue only once.   Pee before and after having sex (intercourse).  Ask your doctor when your test results will be ready. Make sure you follow up and get your test results.   GET HELP RIGHT AWAY IF:    There is very bad back pain or lower belly (abdominal) pain.   You get the chills.   You have a fever.   Your baby is older than 3 months with a rectal temperature of 102 F (38.9 C) or higher.   Your baby is 3 months old or younger with a rectal temperature of 100.4 F (38 C) or higher.   You feel sick to your stomach (nauseous) or throw up (vomit).   There is continued burning with peeing.   Your problems are not better in 3 days. Return sooner if you are getting worse.  MAKE SURE YOU:    Understand these instructions.   Will watch your condition.   Will get help right away if you are not doing well or get worse.  Document Released: 05/27/2008 Document Revised: 03/02/2012 Document Reviewed: 05/27/2008  Exit[employer] Patient Information 2013 [employer].

## 2012-09-23 NOTE — Progress Notes (Signed)
Subjective:    Patient ID: Debra Murphy, female    DOB: [birth year], 56 y.o.   MRN: [id number]  HPI  Debra Murphy is a 56 yr old female who presents today with chief complaint of dysuria.  She denies associated fever, low back pain or frequency.  Some associated lower abdominal discomfort.     Review of Systems    see HPI   Past Medical History  Diagnosis Date  . History of chicken pox   . History of migraine     rare migraines  . Melanoma     Melanoma removed from the neck  . Atrial fibrillation     New diagnosis, asymptomatic, may 22nd, 2013  . Abnormal EKG     May 13, 2012  . Ejection fraction     EF 55-65%, echo, May 19, 2012  . Carotid bruit     Right carotid bruit heard first September 14, 2012.    History   Social History  . Marital Status: Married    Spouse Name: N/A    Number of Children: 2  . Years of Education: N/A   Occupational History  . Not on file.   Social History Main Topics  . Smoking status: Never Smoker   . Smokeless tobacco: Never Used  . Alcohol Use: Yes     social  . Drug Use: Not on file  . Sexually Active: Not on file   Other Topics Concern  . Not on file   Social History Narrative   Regular exercise:  Walks 3 x weeklyNon smokerLooking for work- worked at [employer] over [employer].  Volunteers at [employer] theater and [employer] RestoreComplete collegeMarried for 40 yrs2 daughters- one in [place] and 1 in [place]- no grandchildrenExtended family on 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      Past Surgical History  Procedure Date  . Melanoma excision 2012    melanoma remove from neck    Family History  Problem Relation Age of Onset  . Arthritis Mother   . Heart disease Mother     valve replacement  . Cancer Father     lung, bone, died at 60  . Heart attack Father     in his 38's  . Diabetes Neg Hx   . Stroke Neg Hx     No Known Allergies  Current Outpatient Prescriptions on File Prior to Visit    Medication Sig Dispense Refill  . aspirin EC 81 MG tablet Take 1 tablet (81 mg total) by mouth daily.      . Calcium Carb-Cholecalciferol (CALCIUM 1000 + D PO) Take 1 tablet by mouth 2 (two) times daily.      . Cholecalciferol (VITAMIN D3 PO) Take 600 mg by mouth daily.      Marland Kitchen diltiazem (CARDIZEM CD) 120 MG 24 hr capsule Take 1 capsule (120 mg total) by mouth daily.  30 capsule  6  . Multiple Vitamins-Minerals (PRESERVISION AREDS 2) CAPS Take 2 capsules by mouth 2 (two) times daily.        BP 128/82  Pulse 81  Temp 98.4 F (36.9 C) (Oral)  Resp 16  Wt 134 lb 1.9 oz (60.836 kg)  SpO2 97%  LMP 10/05/2009    Objective:   Physical Exam  Constitutional: She appears well-developed and well-nourished. No distress.  Cardiovascular: Normal rate and regular rhythm.   No murmur heard. Pulmonary/Chest: Effort normal and breath sounds normal. No respiratory distress. She has no wheezes. She  has no rales. She exhibits no tenderness.  Abdominal: Soft. She exhibits no distension. There is no tenderness.  Genitourinary:       Neg CVAT bilaterally  Psychiatric: She has a normal mood and affect. Her behavior is normal. Judgment and thought content normal.          Assessment & Plan:

## 2012-09-24 ENCOUNTER — Encounter (INDEPENDENT_AMBULATORY_CARE_PROVIDER_SITE_OTHER): Payer: BC Managed Care – PPO

## 2012-09-24 DIAGNOSIS — R0989 Other specified symptoms and signs involving the circulatory and respiratory systems: Secondary | ICD-10-CM

## 2012-09-25 ENCOUNTER — Encounter: Payer: Self-pay | Admitting: Cardiology

## 2012-09-25 DIAGNOSIS — R0989 Other specified symptoms and signs involving the circulatory and respiratory systems: Secondary | ICD-10-CM | POA: Insufficient documentation

## 2012-09-26 LAB — URINE CULTURE: Colony Count: >=100000

## 2012-12-14 ENCOUNTER — Encounter: Payer: Self-pay | Admitting: Cardiology

## 2012-12-14 ENCOUNTER — Ambulatory Visit (INDEPENDENT_AMBULATORY_CARE_PROVIDER_SITE_OTHER): Payer: BC Managed Care – PPO | Admitting: Cardiology

## 2012-12-14 VITALS — BP 130/86 | HR 75 | Ht 69.0 in | Wt 136.0 lb

## 2012-12-14 DIAGNOSIS — I4891 Unspecified atrial fibrillation: Secondary | ICD-10-CM

## 2012-12-14 DIAGNOSIS — R0989 Other specified symptoms and signs involving the circulatory and respiratory systems: Secondary | ICD-10-CM

## 2012-12-14 NOTE — Assessment & Plan Note (Signed)
Her carotid Doppler was completely normal. No further workup.

## 2012-12-14 NOTE — Assessment & Plan Note (Signed)
The patient has had had any known recurrent atrial fibrillation. This was asymptomatic when it was first diagnosed. She does not require anticoagulation. I've chosen to allow her to stop her diltiazem. If she feels that she's had any recurrent episodes, she will restart the diltiazem. I will see her for followup in one year.  There is no absolute indication for aspirin at this point. I've encouraged her to cut it back to 2 or 3 times weekly. If there is any concern about the use of aspirin, it could be stopped.

## 2012-12-14 NOTE — Progress Notes (Signed)
HPI  The patient is seen in followup atrial fibrillation. I saw her last September 14, 2012. She's not had any recurrent symptoms since then. We know she is normally function and a normal TSH. I saw her last I didn't hear a high-pitched sound in her neck. Carotid Doppler was done. This showed no evidence of significant carotid disease. No further workup is needed.  No Known Allergies  Current Outpatient Prescriptions  Medication Sig Dispense Refill  . aspirin EC 81 MG tablet Take 1 tablet (81 mg total) by mouth daily.      . Calcium Carb-Cholecalciferol (CALCIUM 1000 + D PO) Take 1 tablet by mouth 2 (two) times daily.      . Cholecalciferol (VITAMIN D3 PO) Take 600 mg by mouth daily.      . ciprofloxacin (CIPRO) 500 MG tablet Take 1 tablet (500 mg total) by mouth 2 (two) times daily.  10 tablet  0  . diltiazem (CARDIZEM CD) 120 MG 24 hr capsule Take 1 capsule (120 mg total) by mouth daily.  30 capsule  6  . Multiple Vitamins-Minerals (PRESERVISION AREDS 2) CAPS Take 2 capsules by mouth 2 (two) times daily.        History   Social History  . Marital Status: Married    Spouse Name: N/A    Number of Children: 2  . Years of Education: N/A   Occupational History  . Not on file.   Social History Main Topics  . Smoking status: Never Smoker   . Smokeless tobacco: Never Used  . Alcohol Use: Yes     Comment: social  . Drug Use: Not on file  . Sexually Active: Not on file   Other Topics Concern  . Not on file   Social History Narrative   Regular exercise:  Walks 3 x weeklyNon smokerLooking for work- worked at [employer] over [employer].  Volunteers at [employer] theater and [employer]oreComplete collegeMarried for 72 yrs2 daughters- one in [place] and 1 in [place]- no grandchildrenExtended family on 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      Family History  Problem Relation Age of Onset  . Arthritis Mother   . Heart disease Mother     valve replacement  .  Cancer Father     lung, bone, died at 54  . Heart attack Father     in his 21's  . Diabetes Neg Hx   . Stroke Neg Hx     Past Medical History  Diagnosis Date  . History of chicken pox   . History of migraine     rare migraines  . Melanoma     Melanoma removed from the neck  . Atrial fibrillation     New diagnosis, asymptomatic, may 22nd, 2013  . Abnormal EKG     May 13, 2012  . Ejection fraction     EF 55-65%, echo, May 19, 2012  . Carotid bruit     High-pitched sound right neck,, Doppler, October, 2013 completely normal    Past Surgical History  Procedure Date  . Melanoma excision 2012    melanoma remove from neck    Patient Active Problem List  Diagnosis  . UTI (lower urinary tract infection)  . Osteopenia  . Hx of melanoma in situ  . General medical examination  . Atrial fibrillation  . Abnormal EKG  . Ejection fraction  . Carotid bruit    ROS   Patient denies fever, chills, headache, sweats,  rash, change in vision, change in hearing, chest pain, cough, nausea vomiting, urinary symptoms. All other systems are reviewed and are negative.  PHYSICAL EXAM  Patient is oriented to person time and place. Affect is normal. Lungs are clear. Respiratory effort is nonlabored. Cardiac exam reveals S1 and S2. There no clicks or significant murmurs. The rhythm is regular. The abdomen is soft. Is no peripheral edema.  Filed Vitals:   12/14/12 0955  BP: 130/86  Pulse: 75  Height: 5\' 9"  (1.753 m)  Weight: 136 lb (61.689 kg)  SpO2: 99%     ASSESSMENT & PLAN

## 2012-12-14 NOTE — Patient Instructions (Addendum)
Your physician wants you to follow-up in: 1 year. You will receive a reminder letter in the mail two months in advance. If you don't receive a letter, please call our office to schedule the follow-up appointment.  

## 2013-03-16 ENCOUNTER — Encounter: Payer: Self-pay | Admitting: Family

## 2013-07-28 ENCOUNTER — Encounter: Payer: Self-pay | Admitting: Family

## 2013-07-28 ENCOUNTER — Other Ambulatory Visit: Payer: Self-pay | Admitting: Family

## 2013-07-28 ENCOUNTER — Ambulatory Visit (INDEPENDENT_AMBULATORY_CARE_PROVIDER_SITE_OTHER): Payer: BC Managed Care – PPO | Admitting: Family

## 2013-07-28 VITALS — BP 120/80 | HR 88 | Temp 97.9°F | Resp 16 | Ht 68.5 in | Wt 132.0 lb

## 2013-07-28 DIAGNOSIS — Z1231 Encounter for screening mammogram for malignant neoplasm of breast: Secondary | ICD-10-CM

## 2013-07-28 DIAGNOSIS — N951 Menopausal and female climacteric states: Secondary | ICD-10-CM

## 2013-07-28 DIAGNOSIS — Z0000 Encounter for general adult medical examination without abnormal findings: Secondary | ICD-10-CM

## 2013-07-28 LAB — CBC WITH DIFFERENTIAL/PLATELET
Basophils Absolute: 0.1 10*3/uL (ref 0.0–0.1)
Basophils Relative: 1 % (ref 0–1)
Eosinophils Absolute: 0.1 10*3/uL (ref 0.0–0.7)
Eosinophils Relative: 1 % (ref 0–5)
HCT: 38 % (ref 36.0–46.0)
Hemoglobin: 12.8 g/dL (ref 12.0–15.0)
MCH: 29.4 pg (ref 26.0–34.0)
MCHC: 33.7 g/dL (ref 30.0–36.0)
MCV: 87.2 fL (ref 78.0–100.0)
Monocytes Absolute: 0.4 10*3/uL (ref 0.1–1.0)
Monocytes Relative: 5 % (ref 3–12)
Neutro Abs: 4.5 10*3/uL (ref 1.7–7.7)
RDW: 13.1 % (ref 11.5–15.5)

## 2013-07-28 LAB — HEPATIC FUNCTION PANEL
ALT: 19 U/L (ref 0–35)
AST: 21 U/L (ref 0–37)
Albumin: 4.4 g/dL (ref 3.5–5.2)
Alkaline Phosphatase: 86 U/L (ref 39–117)
Total Protein: 6.7 g/dL (ref 6.0–8.3)

## 2013-07-28 LAB — URINALYSIS, ROUTINE W REFLEX MICROSCOPIC
Hgb urine dipstick: NEGATIVE
Leukocytes, UA: NEGATIVE
Nitrite: NEGATIVE
Specific Gravity, Urine: 1.02 (ref 1.005–1.030)
pH: 5.5 (ref 5.0–8.0)

## 2013-07-28 LAB — BASIC METABOLIC PANEL WITH GFR
BUN: 14 mg/dL (ref 6–23)
Chloride: 102 mEq/L (ref 96–112)
Creat: 0.76 mg/dL (ref 0.50–1.10)
GFR, Est Non African American: 86 mL/min
Glucose, Bld: 92 mg/dL (ref 70–99)

## 2013-07-28 LAB — LIPID PANEL: Total CHOL/HDL Ratio: 2.3 Ratio

## 2013-07-28 MED ORDER — ESTRADIOL 0.1 MG/GM VA CREA: TOPICAL_CREAM | VAGINAL | Status: DC

## 2013-07-28 NOTE — Assessment & Plan Note (Signed)
Continue healthy diet, exercise.  Obtain mammogram, dexa, fasting labs.

## 2013-07-28 NOTE — Assessment & Plan Note (Signed)
Trial of estrace cream. We discussed risks/benefits of estrace cream and she wishes to proceed,

## 2013-07-28 NOTE — Progress Notes (Signed)
Subjective:    Patient ID: Debra Murphy, female    DOB: Jun 21, 1956, 58 y.o.   MRN: [id number]  HPI  Debra Murphy is a 57 yr old female who presents today for cpx.  Immunizations: up ot date.  Diet: healthy Exercise: trying to exercise- stays "active."  Colonoscopy: up to date Dexa:due  Pap Smear: up to date Mammogram: due  She also wishes to discuss vaginal dryness.  Review of Systems  Constitutional: Negative for unexpected weight change.  HENT: Negative for congestion.   Respiratory: Negative for cough and shortness of breath.   Cardiovascular: Negative for chest pain.  Gastrointestinal: Negative for nausea, vomiting and abdominal pain.  Genitourinary: Negative for dysuria and frequency.  Musculoskeletal: Negative for myalgias and arthralgias.  Skin: Negative for rash.  Neurological: Negative for headaches.  Hematological: Negative for adenopathy.  Psychiatric/Behavioral:       Denies depression/anxiety   Past Medical History  Diagnosis Date  . History of chicken pox   . History of migraine     rare migraines  . Melanoma     Melanoma removed from the neck  . Atrial fibrillation     New diagnosis, asymptomatic, may 22nd, 2013  . Abnormal EKG     May 13, 2012  . Ejection fraction     EF 55-65%, echo, May 19, 2012  . Carotid bruit     High-pitched sound right neck,, Doppler, October, 2013 completely normal    History   Social History  . Marital Status: Married    Spouse Name: N/A    Number of Children: 2  . Years of Education: N/A   Occupational History  . Not on file.   Social History Main Topics  . Smoking status: Never Smoker   . Smokeless tobacco: Never Used  . Alcohol Use: Yes     Comment: social  . Drug Use: Not on file  . Sexually Active: Not on file   Other Topics Concern  . Not on file   Social History Narrative   Regular exercise:  Walks 3 x weekly   Non smoker   Looking for work- worked at [employer] over [employer].  Volunteers at [employer]  and [employer]   Complete college   Married for 33 yrs   2 daughters- one in [place] and 1 in [place]- no grandchildren   Extended family on [place]- came to [place]   No pets   Enjoys reading, yard work, Art gallery manager.               Past Surgical History  Procedure Laterality Date  . Melanoma excision  2012    melanoma remove from neck    Family History  Problem Relation Age of Onset  . Arthritis Mother   . Heart disease Mother     valve replacement  . Cancer Father     lung, bone, died at 23  . Heart attack Father     in his 92's  . Diabetes Neg Hx   . Stroke Neg Hx     No Known Allergies  Current Outpatient Prescriptions on File Prior to Visit  Medication Sig Dispense Refill  . Calcium Carb-Cholecalciferol (CALCIUM 1000 + D PO) Take 1 tablet by mouth 2 (two) times daily.      . Cholecalciferol (VITAMIN D3 PO) Take 600 mg by mouth daily.      . Multiple Vitamins-Minerals (PRESERVISION AREDS 2) CAPS Take 2 capsules by mouth 2 (two) times daily.  No current facility-administered medications on file prior to visit.    BP 120/80  Pulse 88  Temp(Src) 97.9 F (36.6 C) (Oral)  Resp 16  Ht 5' 8.5" (1.74 m)  Wt 132 lb 0.6 oz (59.893 kg)  BMI 19.78 kg/m2  SpO2 99%  LMP 10/05/2009       Objective:   Physical Exam  Physical Exam  Constitutional: She is oriented to person, place, and time. She appears well-developed and well-nourished. No distress.  HENT:  Head: Normocephalic and atraumatic.  Right Ear: Tympanic membrane and ear canal normal.  Left Ear: Tympanic membrane and ear canal normal.  Mouth/Throat: Oropharynx is clear and moist.  Eyes: Pupils are equal, round, and reactive to light. No scleral icterus.  Neck: Normal range of motion. No thyromegaly present.  Cardiovascular: Normal rate and regular rhythm.   No murmur heard. Pulmonary/Chest: Effort normal and breath sounds normal. No respiratory distress. He has no wheezes. She has no  rales. She exhibits no tenderness.  Abdominal: Soft. Bowel sounds are normal. He exhibits no distension and no mass. There is no tenderness. There is no rebound and no guarding.  Musculoskeletal: She exhibits no edema.  Lymphadenopathy:    She has no cervical adenopathy.  Neurological: She is alert and oriented to person, place, and time. She exhibits normal muscle tone. Coordination normal.  Skin: Skin is warm and dry.  Psychiatric: She has a normal mood and affect. Her behavior is normal. Judgment and thought content normal.  Breasts: Examined lying Right: Without masses, retractions, discharge or axillary adenopathy.  Left:  Without masses, retractions, discharge or axillary adenopathy.             Assessment & Plan:         Assessment & Plan:

## 2013-07-28 NOTE — Patient Instructions (Addendum)
Please schedule bone density at the front desk and mammogram on the first floor in imaging. Follow up in 6 months.

## 2013-07-29 LAB — VITAMIN D 25 HYDROXY (VIT D DEFICIENCY, FRACTURES): Vit D, 25-Hydroxy: 59 ng/mL (ref 30–89)

## 2013-07-29 LAB — TSH: TSH: 1.179 u[IU]/mL (ref 0.350–4.500)

## 2013-07-30 ENCOUNTER — Encounter: Payer: Self-pay | Admitting: Family

## 2013-07-30 ENCOUNTER — Telehealth: Payer: Self-pay | Admitting: *Deleted

## 2013-07-30 NOTE — Telephone Encounter (Signed)
Received message from pt stating estradiol is too expensive at $180.  Wants to know if there is a cheaper alternative.

## 2013-08-02 NOTE — Telephone Encounter (Signed)
Notified pt and she voices understanding. 

## 2013-08-02 NOTE — Telephone Encounter (Signed)
Unfortunately, I am unaware of a cheaper hormonal alternative. She could try using replens which is an otc vaginal moisturizer.

## 2013-08-03 ENCOUNTER — Ambulatory Visit (INDEPENDENT_AMBULATORY_CARE_PROVIDER_SITE_OTHER)
Admission: RE | Admit: 2013-08-03 | Discharge: 2013-08-03 | Disposition: A | Discharge status: Home / Self Care | Payer: BC Managed Care – PPO | Source: Ambulatory Visit | Attending: Family | Admitting: Family

## 2013-08-03 DIAGNOSIS — Z0000 Encounter for general adult medical examination without abnormal findings: Secondary | ICD-10-CM

## 2013-08-04 ENCOUNTER — Ambulatory Visit (HOSPITAL_BASED_OUTPATIENT_CLINIC_OR_DEPARTMENT_OTHER)
Admission: RE | Admit: 2013-08-04 | Discharge: 2013-08-04 | Disposition: A | Discharge status: Home / Self Care | Payer: BC Managed Care – PPO | Source: Ambulatory Visit | Attending: Family | Admitting: Family

## 2013-08-04 DIAGNOSIS — Z1231 Encounter for screening mammogram for malignant neoplasm of breast: Secondary | ICD-10-CM | POA: Insufficient documentation

## 2013-08-10 ENCOUNTER — Encounter: Payer: Self-pay | Admitting: Family

## 2013-10-28 ENCOUNTER — Other Ambulatory Visit: Payer: Self-pay

## 2013-12-28 ENCOUNTER — Ambulatory Visit: Payer: BC Managed Care – PPO | Admitting: Family

## 2014-08-02 ENCOUNTER — Encounter: Payer: Self-pay | Admitting: Family

## 2014-08-02 ENCOUNTER — Other Ambulatory Visit: Payer: Self-pay | Admitting: Family

## 2014-08-02 ENCOUNTER — Ambulatory Visit (INDEPENDENT_AMBULATORY_CARE_PROVIDER_SITE_OTHER): Payer: BC Managed Care – PPO | Admitting: Family

## 2014-08-02 ENCOUNTER — Other Ambulatory Visit (HOSPITAL_COMMUNITY)
Admission: RE | Admit: 2014-08-02 | Discharge: 2014-08-02 | Disposition: A | Discharge status: Home / Self Care | Payer: BC Managed Care – PPO | Source: Ambulatory Visit | Attending: Family | Admitting: Family

## 2014-08-02 VITALS — BP 120/80 | HR 85 | Temp 98.0°F | Resp 16 | Ht 68.5 in | Wt 131.0 lb

## 2014-08-02 DIAGNOSIS — Z1151 Encounter for screening for human papillomavirus (HPV): Secondary | ICD-10-CM | POA: Insufficient documentation

## 2014-08-02 DIAGNOSIS — Z01419 Encounter for gynecological examination (general) (routine) without abnormal findings: Secondary | ICD-10-CM

## 2014-08-02 DIAGNOSIS — Z1231 Encounter for screening mammogram for malignant neoplasm of breast: Secondary | ICD-10-CM

## 2014-08-02 DIAGNOSIS — Z0000 Encounter for general adult medical examination without abnormal findings: Secondary | ICD-10-CM

## 2014-08-02 LAB — CBC WITH DIFFERENTIAL/PLATELET
Basophils Absolute: 0.1 10*3/uL (ref 0.0–0.1)
Basophils Relative: 1 % (ref 0–1)
Eosinophils Absolute: 0.1 10*3/uL (ref 0.0–0.7)
Eosinophils Relative: 2 % (ref 0–5)
HCT: 36.3 % (ref 36.0–46.0)
HEMOGLOBIN: 11.8 g/dL — AB (ref 12.0–15.0)
LYMPHS ABS: 1.3 10*3/uL (ref 0.7–4.0)
LYMPHS PCT: 23 % (ref 12–46)
MCH: 26.9 pg (ref 26.0–34.0)
MCHC: 32.5 g/dL (ref 30.0–36.0)
MCV: 82.9 fL (ref 78.0–100.0)
MONOS PCT: 6 % (ref 3–12)
Monocytes Absolute: 0.3 10*3/uL (ref 0.1–1.0)
NEUTROS ABS: 3.8 10*3/uL (ref 1.7–7.7)
NEUTROS PCT: 68 % (ref 43–77)
Platelets: 167 10*3/uL (ref 150–400)
RBC: 4.38 MIL/uL (ref 3.87–5.11)
RDW: 14.9 % (ref 11.5–15.5)
WBC: 5.6 10*3/uL (ref 4.0–10.5)

## 2014-08-02 LAB — BASIC METABOLIC PANEL WITH GFR
BUN: 20 mg/dL (ref 6–23)
CO2: 29 mEq/L (ref 19–32)
Calcium: 9.5 mg/dL (ref 8.4–10.5)
Chloride: 103 mEq/L (ref 96–112)
Creat: 0.79 mg/dL (ref 0.50–1.10)
GFR, EST NON AFRICAN AMERICAN: 82 mL/min
GLUCOSE: 90 mg/dL (ref 70–99)
POTASSIUM: 4.6 meq/L (ref 3.5–5.3)
SODIUM: 141 meq/L (ref 135–145)

## 2014-08-02 LAB — HEPATIC FUNCTION PANEL
ALK PHOS: 90 U/L (ref 39–117)
ALT: 12 U/L (ref 0–35)
AST: 17 U/L (ref 0–37)
Albumin: 4.4 g/dL (ref 3.5–5.2)
BILIRUBIN DIRECT: 0.1 mg/dL (ref 0.0–0.3)
BILIRUBIN TOTAL: 0.6 mg/dL (ref 0.2–1.2)
Indirect Bilirubin: 0.5 mg/dL (ref 0.2–1.2)
Total Protein: 6.9 g/dL (ref 6.0–8.3)

## 2014-08-02 LAB — TSH: TSH: 2.03 u[IU]/mL (ref 0.350–4.500)

## 2014-08-02 LAB — LIPID PANEL
CHOL/HDL RATIO: 2.6 ratio
Cholesterol: 171 mg/dL (ref 0–200)
HDL: 66 mg/dL (ref >39)
LDL Cholesterol: 88 mg/dL (ref 0–99)
Triglycerides: 87 mg/dL (ref <150)
VLDL: 17 mg/dL (ref 0–40)

## 2014-08-02 NOTE — Patient Instructions (Signed)
Please complete lab work prior to leaving. Continue regular walking and healthy diet. Check with your insurance re: coverage of zostavax (shingles vaccine) and then contact us to schedule a nurse visit.  Follow up in 1 year for annual physical.

## 2014-08-02 NOTE — Assessment & Plan Note (Signed)
Discuss healthy diet, exercise.  Due for mammogram. Pap performed. She will check coverage of zostavax with her insurance and contact us to schedule administration.

## 2014-08-02 NOTE — Progress Notes (Signed)
Subjective:    Patient ID: Cecille Amsterdam, female    DOB: 02-25-1956, 58 y.o.   MRN: [id number]  HPI  Patient presents today for complete physical.  Immunizations: will confirm coverage of zostavax. Diet: reports healthy diet Exercise: walking Colonoscopy: 2013- normal, rec 10 year follow up. [id number] Pap Smear: 05/13/2012- due Mammogram: will schedule  Has been using replens, did not start estrace   Review of Systems  Constitutional: Negative for unexpected weight change.  HENT: Negative for hearing loss and rhinorrhea.   Eyes: Negative for visual disturbance.  Respiratory: Negative for cough and shortness of breath.   Cardiovascular: Negative for chest pain.  Gastrointestinal: Negative for nausea, vomiting and diarrhea.  Genitourinary: Negative for dysuria and frequency.  Musculoskeletal: Negative for arthralgias and myalgias.  Skin: Negative for rash.  Neurological: Negative for headaches.  Hematological: Negative for adenopathy.  Psychiatric/Behavioral:       Denies depression/anxiety   Past Medical History  Diagnosis Date  . History of chicken pox   . History of migraine     rare migraines  . Melanoma     Melanoma removed from the neck  . Atrial fibrillation     New diagnosis, asymptomatic, may 22nd, 2013  . Abnormal EKG     May 13, 2012  . Ejection fraction     EF 55-65%, echo, May 19, 2012  . Carotid bruit     High-pitched sound right neck,, Doppler, October, 2013 completely normal    History   Social History  . Marital Status: Married    Spouse Name: N/A    Number of Children: 2  . Years of Education: N/A   Occupational History  . Not on file.   Social History Main Topics  . Smoking status: Never Smoker   . Smokeless tobacco: Never Used  . Alcohol Use: Yes     Comment: social  . Drug Use: Not on file  . Sexual Activity: Not on file   Other Topics Concern  . Not on file   Social History Narrative   Regular exercise:  Walks 3 x weekly   Non smoker   Volunteers at [employer] and [employer]   Complete college   Married for 33 yrs   2 daughters- one in [place] and 1 in [place]- no grandchildren   Extended family on [place]- came to [place]   No pets   Enjoys reading, yard work, Holiday representative.                  Past Surgical History  Procedure Laterality Date  . Melanoma excision  2012    melanoma remove from neck    Family History  Problem Relation Age of Onset  . Arthritis Mother   . Heart disease Mother     valve replacement  . Cancer Father     lung, bone, died at 28  . Heart attack Father     in his 49's  . Diabetes Neg Hx   . Stroke Neg Hx     No Known Allergies  Current Outpatient Prescriptions on File Prior to Visit  Medication Sig Dispense Refill  . aspirin (BABY ASPIRIN) 81 MG chewable tablet Take 1 tablet two times a week.      . Calcium Carb-Cholecalciferol (CALCIUM 1000 + D PO) Take 1 tablet by mouth 2 (two) times daily.      . Cholecalciferol (VITAMIN D3 PO) Take 600 mg by mouth daily.      Marland Kitchen  Multiple Vitamins-Minerals (PRESERVISION AREDS 2) CAPS Take 2 capsules by mouth 2 (two) times daily.      . Vaginal Lubricant (REPLENS) GEL Place vaginally as needed.       No current facility-administered medications on file prior to visit.    BP 120/80  Pulse 85  Temp(Src) 98 F (36.7 C) (Oral)  Resp 16  Ht 5' 8.5" (1.74 m)  Wt 131 lb (59.421 kg)  BMI 19.63 kg/m2  SpO2 99%  LMP 10/05/2009       Objective:   Physical Exam  Physical Exam  Constitutional: She is oriented to person, place, and time. She appears well-developed and well-nourished. No distress.  HENT:  Head: Normocephalic and atraumatic.  Right Ear: Tympanic membrane and ear canal normal.  Left Ear: Tympanic membrane and ear canal normal.  Mouth/Throat: Oropharynx is clear and moist.  Eyes: Pupils are equal, round, and reactive to light. No scleral icterus.  Neck: Normal range of motion. No thyromegaly present.    Cardiovascular: Normal rate and regular rhythm.   No murmur heard. Pulmonary/Chest: Effort normal and breath sounds normal. No respiratory distress. He has no wheezes. She has no rales. She exhibits no tenderness.  Abdominal: Soft. Bowel sounds are normal. He exhibits no distension and no mass. There is no tenderness. There is no rebound and no guarding.  Musculoskeletal: She exhibits no edema.  Lymphadenopathy:    She has no cervical adenopathy.  Neurological: She is alert and oriented to person, place, and time.  She exhibits normal muscle tone. Coordination normal.  Skin: Skin is warm and dry.  Psychiatric: She has a normal mood and affect. Her behavior is normal. Judgment and thought content normal.  Breasts: Examined lying Right: Without masses, retractions, discharge or axillary adenopathy.  Left: Without masses, retractions, discharge or axillary adenopathy.  Inguinal/mons: Normal without inguinal adenopathy  External genitalia: Normal  BUS/Urethra/Skene's glands: Normal  Bladder: Normal  Vagina: atrophic Cervix: Normal  Uterus: normal in size, shape and contour. Midline and mobile  Adnexa/parametria:  Rt: Without masses or tenderness.  Lt: Without masses or tenderness.  Anus and perineum: Normal           Assessment & Plan:         Assessment & Plan:

## 2014-08-02 NOTE — Progress Notes (Signed)
Pre visit review using our clinic review tool, if applicable. No additional management support is needed unless otherwise documented below in the visit note. 

## 2014-08-03 LAB — URINALYSIS, MICROSCOPIC ONLY
CASTS: NONE SEEN
CRYSTALS: NONE SEEN
Squamous Epithelial / LPF: NONE SEEN

## 2014-08-03 LAB — URINALYSIS, ROUTINE W REFLEX MICROSCOPIC
Bilirubin Urine: NEGATIVE
GLUCOSE, UA: NEGATIVE mg/dL
KETONES UR: NEGATIVE mg/dL
NITRITE: POSITIVE — AB
PROTEIN: 30 mg/dL — AB
Specific Gravity, Urine: 1.025 (ref 1.005–1.030)
Urobilinogen, UA: 0.2 mg/dL (ref 0.0–1.0)
pH: 6 (ref 5.0–8.0)

## 2014-08-04 ENCOUNTER — Telehealth: Payer: Self-pay | Admitting: Family

## 2014-08-04 DIAGNOSIS — D649 Anemia, unspecified: Secondary | ICD-10-CM

## 2014-08-04 NOTE — Telephone Encounter (Signed)
Lab work shows mild anemia. I recommend that she complete IFOB, dx anemia + labs as pended below. Pap is pending. Other labs look good.

## 2014-08-05 LAB — IRON AND TIBC
%SAT: 9 % — AB (ref 20–55)
IRON: 38 ug/dL — AB (ref 42–145)
TIBC: 412 ug/dL (ref 250–470)
UIBC: 374 ug/dL (ref 125–400)

## 2014-08-05 LAB — CYTOLOGY - PAP

## 2014-08-05 NOTE — Telephone Encounter (Signed)
Notified pt and she voices understanding. Lab orders signed and IFOB placed at front desk for pick up.

## 2014-08-06 LAB — VITAMIN B12: Vitamin B-12: 386 pg/mL (ref 211–911)

## 2014-08-07 ENCOUNTER — Telehealth: Payer: Self-pay | Admitting: Family

## 2014-08-07 NOTE — Telephone Encounter (Addendum)
Please notify pt that pap smear is normal.  Iron level is a little low.  Add slo fe 1 tab by mouth once daily.  Repeat cbc/serum iron in 3 months. Complete IFOB and return at earliest convenience.

## 2014-08-09 NOTE — Telephone Encounter (Signed)
Spoke with pt. She's aware. LDM

## 2014-08-10 ENCOUNTER — Ambulatory Visit (HOSPITAL_BASED_OUTPATIENT_CLINIC_OR_DEPARTMENT_OTHER)
Admission: RE | Admit: 2014-08-10 | Discharge: 2014-08-10 | Disposition: A | Discharge status: Home / Self Care | Payer: BC Managed Care – PPO | Source: Ambulatory Visit | Attending: Family | Admitting: Family

## 2014-08-10 ENCOUNTER — Ambulatory Visit (INDEPENDENT_AMBULATORY_CARE_PROVIDER_SITE_OTHER): Payer: BC Managed Care – PPO | Admitting: *Deleted

## 2014-08-10 DIAGNOSIS — Z1231 Encounter for screening mammogram for malignant neoplasm of breast: Secondary | ICD-10-CM | POA: Diagnosis present

## 2014-08-10 DIAGNOSIS — R928 Other abnormal and inconclusive findings on diagnostic imaging of breast: Secondary | ICD-10-CM | POA: Insufficient documentation

## 2014-08-10 DIAGNOSIS — Z2911 Encounter for prophylactic immunotherapy for respiratory syncytial virus (RSV): Secondary | ICD-10-CM

## 2014-08-10 DIAGNOSIS — Z23 Encounter for immunization: Secondary | ICD-10-CM

## 2014-08-10 NOTE — Progress Notes (Signed)
   Subjective:    Patient ID: Debra Murphy, female    DOB: 07-Jan-1956, 58 y.o.   MRN: [id number]  HPI    Review of Systems     Objective:   Physical Exam        Assessment & Plan:  After obtaining consent, and per orders of Debbrah Alar, FNP, injection of Zostavax 0.65 mL Subq was given by SHARON SCATES, CMA (AAMA). Patient tolerated injection well in right arm, and informed to report any adverse reaction immediately/SLS

## 2014-08-12 ENCOUNTER — Other Ambulatory Visit: Payer: Self-pay | Admitting: Family

## 2014-08-12 DIAGNOSIS — R928 Other abnormal and inconclusive findings on diagnostic imaging of breast: Secondary | ICD-10-CM

## 2014-08-15 MED ORDER — FERROUS SULFATE DRIED ER 140 (45 FE) MG PO TBCR: 1.0000 | EXTENDED_RELEASE_TABLET | Freq: Every day | ORAL | Status: DC

## 2014-08-15 NOTE — Telephone Encounter (Signed)
Pt left message that [facility] never received below rx. Rx sent and left detailed message on pt's home # to call if any further problems.

## 2014-08-18 ENCOUNTER — Ambulatory Visit
Admission: RE | Admit: 2014-08-18 | Discharge: 2014-08-18 | Disposition: A | Discharge status: Home / Self Care | Payer: BC Managed Care – PPO | Source: Ambulatory Visit | Attending: Family | Admitting: Family

## 2014-08-18 DIAGNOSIS — R928 Other abnormal and inconclusive findings on diagnostic imaging of breast: Secondary | ICD-10-CM

## 2014-11-07 ENCOUNTER — Encounter: Payer: Self-pay | Admitting: Family

## 2014-11-07 ENCOUNTER — Ambulatory Visit (INDEPENDENT_AMBULATORY_CARE_PROVIDER_SITE_OTHER): Payer: BC Managed Care – PPO | Admitting: Family

## 2014-11-07 VITALS — BP 100/74 | HR 87 | Temp 98.1°F | Resp 16 | Ht 68.5 in | Wt 134.4 lb

## 2014-11-07 DIAGNOSIS — D649 Anemia, unspecified: Secondary | ICD-10-CM

## 2014-11-07 DIAGNOSIS — D509 Iron deficiency anemia, unspecified: Secondary | ICD-10-CM | POA: Insufficient documentation

## 2014-11-07 NOTE — Progress Notes (Signed)
Pre visit review using our clinic review tool, if applicable. No additional management support is needed unless otherwise documented below in the visit note. 

## 2014-11-07 NOTE — Patient Instructions (Addendum)
Please complete lab work prior to leaving. Return your stool kit at your earliest convenience.   Follow up in 6 months.

## 2014-11-07 NOTE — Progress Notes (Signed)
Subjective:    Patient ID: Debra Murphy, female    DOB: Feb 16, 1956, 58 y.o.   MRN: [id number]  HPI  Debra Murphy is a 58 yr old female who presents today for follow up of her anemia.  She was placed on iron supplement once daily back in mid August.  She reports colo 2013. Did not complete IFOB kit.  She reports + compliance with iron and denies any associated constipation.   Lab Results  Component Value Date   WBC 5.6 08/02/2014   HGB 11.8* 08/02/2014   HCT 36.3 08/02/2014   MCV 82.9 08/02/2014   PLT 167 08/02/2014     Review of Systems See HPI  Past Medical History  Diagnosis Date  . History of chicken pox   . History of migraine     rare migraines  . Melanoma     Melanoma removed from the neck  . Atrial fibrillation     New diagnosis, asymptomatic, may 22nd, 2013  . Abnormal EKG     May 13, 2012  . Ejection fraction     EF 55-65%, echo, May 19, 2012  . Carotid bruit     High-pitched sound right neck,, Doppler, October, 2013 completely normal    History   Social History  . Marital Status: Married    Spouse Name: N/A    Number of Children: 2  . Years of Education: N/A   Occupational History  . Not on file.   Social History Main Topics  . Smoking status: Never Smoker   . Smokeless tobacco: Never Used  . Alcohol Use: Yes     Comment: social  . Drug Use: Not on file  . Sexual Activity: Not on file   Other Topics Concern  . Not on file   Social History Narrative   Regular exercise:  Walks 3 x weekly   Non smoker   Volunteers at [employer] and [employer]   Complete college   Married for 33 yrs   2 daughters- one in [place] and 1 in [place]- no grandchildren   Extended family on [place]- came to [place]   No pets   Enjoys reading, yard work, Holiday representative.                  Past Surgical History  Procedure Laterality Date  . Melanoma excision  2012    melanoma remove from neck    Family History  Problem Relation Age of Onset  .  Arthritis Mother   . Heart disease Mother     valve replacement  . Cancer Father     lung, bone, died at 79  . Heart attack Father     in his 43's  . Diabetes Neg Hx   . Stroke Neg Hx     No Known Allergies  Current Outpatient Prescriptions on File Prior to Visit  Medication Sig Dispense Refill  . aspirin (BABY ASPIRIN) 81 MG chewable tablet Take 1 tablet two times a week.    . Calcium Carb-Cholecalciferol (CALCIUM 1000 + D PO) Take 1 tablet by mouth 2 (two) times daily.    . Cholecalciferol (VITAMIN D3 PO) Take 600 mg by mouth daily.    . Multiple Vitamins-Minerals (PRESERVISION AREDS 2) CAPS Take 2 capsules by mouth 2 (two) times daily.    . Vaginal Lubricant (REPLENS) GEL Place vaginally as needed.     No current facility-administered medications on file prior to visit.    BP 100/74  mmHg  Pulse 87  Temp(Src) 98.1 F (36.7 C) (Oral)  Resp 16  Ht 5' 8.5" (1.74 m)  Wt 134 lb 6.4 oz (60.963 kg)  BMI 20.14 kg/m2  SpO2 99%  LMP 10/05/2009       Objective:   Physical Exam  Constitutional: She is oriented to person, place, and time. She appears well-developed and well-nourished. No distress.  Cardiovascular: Normal rate and regular rhythm.   No murmur heard. Pulmonary/Chest: Effort normal and breath sounds normal. No respiratory distress. She has no wheezes. She has no rales. She exhibits no tenderness.  Neurological: She is alert and oriented to person, place, and time.  Psychiatric: She has a normal mood and affect. Her behavior is normal. Judgment and thought content normal.          Assessment & Plan:

## 2014-11-07 NOTE — Assessment & Plan Note (Signed)
Obtain IFOB (pt counseled on importance of completing). Continue iron, obtain follow up cbc and serum iron level.

## 2014-11-08 LAB — CBC WITH DIFFERENTIAL/PLATELET
BASOS ABS: 0 10*3/uL (ref 0.0–0.1)
BASOS PCT: 0.2 % (ref 0.0–3.0)
EOS ABS: 0.2 10*3/uL (ref 0.0–0.7)
Eosinophils Relative: 2.5 % (ref 0.0–5.0)
HEMATOCRIT: 36.7 % (ref 36.0–46.0)
HEMOGLOBIN: 12 g/dL (ref 12.0–15.0)
LYMPHS ABS: 1.3 10*3/uL (ref 0.7–4.0)
LYMPHS PCT: 19.5 % (ref 12.0–46.0)
MCHC: 32.7 g/dL (ref 30.0–36.0)
MCV: 88.7 fl (ref 78.0–100.0)
MONO ABS: 0.4 10*3/uL (ref 0.1–1.0)
Monocytes Relative: 5.5 % (ref 3.0–12.0)
NEUTROS ABS: 4.8 10*3/uL (ref 1.4–7.7)
Neutrophils Relative %: 72.3 % (ref 43.0–77.0)
Platelets: 154 10*3/uL (ref 150.0–400.0)
RBC: 4.13 Mil/uL (ref 3.87–5.11)
RDW: 14.7 % (ref 11.5–15.5)
WBC: 6.6 10*3/uL (ref 4.0–10.5)

## 2014-11-08 LAB — IRON: Iron: 97 ug/dL (ref 42–145)

## 2014-11-09 ENCOUNTER — Encounter: Payer: Self-pay | Admitting: Family

## 2014-11-23 NOTE — Telephone Encounter (Signed)
Notified pt. 

## 2014-11-23 NOTE — Telephone Encounter (Signed)
Please contact pt re: unread mychart message. 

## 2015-05-09 ENCOUNTER — Ambulatory Visit: Payer: BC Managed Care – PPO | Admitting: Family

## 2015-08-07 ENCOUNTER — Telehealth: Payer: Self-pay | Admitting: *Deleted

## 2015-08-07 NOTE — Telephone Encounter (Signed)
Unable to reach patient at time of Pre-Visit Call.  Patient is unavailable today but husband confirmed appointment.

## 2015-08-08 ENCOUNTER — Telehealth: Payer: Self-pay | Admitting: Family

## 2015-08-08 ENCOUNTER — Ambulatory Visit (INDEPENDENT_AMBULATORY_CARE_PROVIDER_SITE_OTHER): Payer: 59 | Admitting: Family

## 2015-08-08 ENCOUNTER — Encounter: Payer: Self-pay | Admitting: Family

## 2015-08-08 VITALS — BP 108/72 | HR 74 | Temp 98.2°F | Resp 16 | Ht 69.0 in | Wt 131.0 lb

## 2015-08-08 DIAGNOSIS — Z0000 Encounter for general adult medical examination without abnormal findings: Secondary | ICD-10-CM

## 2015-08-08 LAB — URINALYSIS, ROUTINE W REFLEX MICROSCOPIC
Bilirubin Urine: NEGATIVE
HGB URINE DIPSTICK: NEGATIVE
KETONES UR: NEGATIVE
Nitrite: POSITIVE — AB
Specific Gravity, Urine: 1.015 (ref 1.000–1.030)
Total Protein, Urine: 30 — AB
URINE GLUCOSE: NEGATIVE
UROBILINOGEN UA: 0.2 (ref 0.0–1.0)
pH: 7.5 (ref 5.0–8.0)

## 2015-08-08 LAB — BASIC METABOLIC PANEL
BUN: 20 mg/dL (ref 6–23)
CHLORIDE: 105 meq/L (ref 96–112)
CO2: 34 mEq/L — ABNORMAL HIGH (ref 19–32)
CREATININE: 0.67 mg/dL (ref 0.40–1.20)
Calcium: 9.2 mg/dL (ref 8.4–10.5)
GFR: 95 mL/min (ref >60.00)
Glucose, Bld: 91 mg/dL (ref 70–99)
Potassium: 4 mEq/L (ref 3.5–5.1)
SODIUM: 142 meq/L (ref 135–145)

## 2015-08-08 LAB — HEPATIC FUNCTION PANEL
ALT: 18 U/L (ref 0–35)
AST: 19 U/L (ref 0–37)
Albumin: 4.2 g/dL (ref 3.5–5.2)
Alkaline Phosphatase: 76 U/L (ref 39–117)
BILIRUBIN DIRECT: 0.1 mg/dL (ref 0.0–0.3)
BILIRUBIN TOTAL: 0.7 mg/dL (ref 0.2–1.2)
Total Protein: 6.6 g/dL (ref 6.0–8.3)

## 2015-08-08 LAB — TSH: TSH: 1.46 u[IU]/mL (ref 0.35–4.50)

## 2015-08-08 MED ORDER — SULFAMETHOXAZOLE-TRIMETHOPRIM 800-160 MG PO TABS: 1.0000 | ORAL_TABLET | Freq: Two times a day (BID) | ORAL | Status: DC

## 2015-08-08 NOTE — Patient Instructions (Signed)
Continue healthy diet and exercise. Complete lab work prior to leaving. Schedule mammogram on the first floor in the imaging department.

## 2015-08-08 NOTE — Assessment & Plan Note (Signed)
Immunizations reviewed and up to date.  Continue healthy diet, exercise.  Obtain routine labs.  Refer for mammogram.

## 2015-08-08 NOTE — Progress Notes (Signed)
Subjective:    Patient ID: Cecille Amsterdam, female    DOB: 31-Mar-1956, 59 y.o.   MRN: [id number]  HPI  Ms. Ra is a 59 yr old female who presents today for cpx  Immunizations: 2009 Diet: healthy Exercise: walks Colonoscopy:  2013- normal. Dexa: 8/14 Pap Smear: 8/15 Mammogram:  due Eye exam- tomorrow Dental- today    Review of Systems  Constitutional: Negative for unexpected weight change.  HENT: Negative for hearing loss and rhinorrhea.   Eyes: Negative for visual disturbance.  Respiratory: Negative for cough.   Cardiovascular: Negative for leg swelling.  Gastrointestinal: Negative for nausea, diarrhea and constipation.  Genitourinary: Negative for dysuria and frequency.  Musculoskeletal: Negative for myalgias.       Strained muscle right arm- mild  Skin: Negative for rash.  Neurological:       Rare headaches  Hematological: Negative for adenopathy.  Psychiatric/Behavioral: Negative for dysphoric mood and agitation.   Past Medical History  Diagnosis Date  . History of chicken pox   . History of migraine     rare migraines  . Melanoma     Melanoma removed from the neck  . Atrial fibrillation     New diagnosis, asymptomatic, may 22nd, 2013  . Abnormal EKG     May 13, 2012  . Ejection fraction     EF 55-65%, echo, May 19, 2012  . Carotid bruit     High-pitched sound right neck,, Doppler, October, 2013 completely normal    Social History   Social History  . Marital Status: Married    Spouse Name: N/A  . Number of Children: 2  . Years of Education: N/A   Occupational History  . Not on file.   Social History Main Topics  . Smoking status: Never Smoker   . Smokeless tobacco: Never Used  . Alcohol Use: Yes     Comment: social  . Drug Use: Not on file  . Sexual Activity: Not on file   Other Topics Concern  . Not on file   Social History Narrative   Regular exercise:  Walks 3 x weekly   Non smoker   Volunteers at [employer] and [employer]   Complete college   Married for 33 yrs   2 daughters- one in [place] and 1 in [place]- no grandchildren   Extended family on [place]- came to [place]   No pets   Enjoys reading, yard work, Holiday representative.                  Past Surgical History  Procedure Laterality Date  . Melanoma excision  2012    melanoma remove from neck    Family History  Problem Relation Age of Onset  . Arthritis Mother   . Heart disease Mother     valve replacement  . Cancer Father     lung, bone, died at 55  . Heart attack Father     in his 69's  . Diabetes Neg Hx   . Stroke Neg Hx     No Known Allergies  Current Outpatient Prescriptions on File Prior to Visit  Medication Sig Dispense Refill  . aspirin (BABY ASPIRIN) 81 MG chewable tablet Take 1 tablet two times a week.    . Calcium Carb-Cholecalciferol (CALCIUM 1000 + D PO) Take 1 tablet by mouth 2 (two) times daily.    . Cholecalciferol (VITAMIN D3 PO) Take 600 mg by mouth daily.    . Multiple Vitamins-Minerals (PRESERVISION  AREDS 2) CAPS Take 2 capsules by mouth 2 (two) times daily.    Marland Kitchen SLOW RELEASE IRON 45 MG TBCR Take 1 tablet by mouth 2 (two) times daily.  3  . Vaginal Lubricant (REPLENS) GEL Place vaginally as needed.     No current facility-administered medications on file prior to visit.    BP 108/72 mmHg  Pulse 74  Temp(Src) 98.2 F (36.8 C) (Oral)  Resp 16  Ht 5\' 9"  (1.753 m)  Wt 131 lb (59.421 kg)  BMI 19.34 kg/m2  SpO2 98%  LMP 10/05/2009       Objective:   Physical Exam Physical Exam  Constitutional: She is oriented to person, place, and time. She appears well-developed and well-nourished. No distress.  HENT:  Head: Normocephalic and atraumatic.  Right Ear: Tympanic membrane and ear canal normal.  Left Ear: Tympanic membrane and ear canal normal.  Mouth/Throat: Oropharynx is clear and moist.  Eyes: Pupils are equal, round, and reactive to light. No scleral icterus.  Neck: Normal range of motion. No thyromegaly  present.  Cardiovascular: Normal rate and regular rhythm.   No murmur heard. Pulmonary/Chest: Effort normal and breath sounds normal. No respiratory distress. He has no wheezes. She has no rales. She exhibits no tenderness.  Abdominal: Soft. Bowel sounds are normal. He exhibits no distension and no mass. There is no tenderness. There is no rebound and no guarding.  Musculoskeletal: She exhibits no edema.  Lymphadenopathy:    She has no cervical adenopathy.  Neurological: She is alert and oriented to person, place, and time. She has normal patellar reflexes. She exhibits normal muscle tone. Coordination normal.  Skin: Skin is warm and dry.  Psychiatric: She has a normal mood and affect. Her behavior is normal. Judgment and thought content normal.  Breasts: Examined lying Right: Without masses, retractions, discharge or axillary adenopathy.  Left: Without masses, retractions, discharge or axillary adenopathy.  Pelvic:  Deferred.        Assessment & Plan:           Assessment & Plan:  EKG tracing is personally reviewed and compared to previous EKG. EKG appears unchanged.  NSR noted.

## 2015-08-08 NOTE — Telephone Encounter (Signed)
UA shows probable UTI. Advise bactrim bid x 3 days. Call if dysuria, frequency or fever.

## 2015-08-08 NOTE — Progress Notes (Signed)
Pre visit review using our clinic review tool, if applicable. No additional management support is needed unless otherwise documented below in the visit note. 

## 2015-08-09 NOTE — Telephone Encounter (Signed)
Notified pt and she voices understanding. 

## 2015-08-15 ENCOUNTER — Ambulatory Visit (HOSPITAL_BASED_OUTPATIENT_CLINIC_OR_DEPARTMENT_OTHER)
Admission: RE | Admit: 2015-08-15 | Discharge: 2015-08-15 | Disposition: A | Discharge status: Home / Self Care | Payer: 59 | Source: Ambulatory Visit | Attending: Family | Admitting: Family

## 2015-08-15 DIAGNOSIS — Z1231 Encounter for screening mammogram for malignant neoplasm of breast: Secondary | ICD-10-CM | POA: Diagnosis present

## 2015-08-15 DIAGNOSIS — R928 Other abnormal and inconclusive findings on diagnostic imaging of breast: Secondary | ICD-10-CM | POA: Diagnosis not present

## 2015-08-15 DIAGNOSIS — Z0000 Encounter for general adult medical examination without abnormal findings: Secondary | ICD-10-CM

## 2015-08-17 ENCOUNTER — Encounter: Payer: Self-pay | Admitting: Family

## 2015-08-17 ENCOUNTER — Other Ambulatory Visit: Payer: Self-pay | Admitting: Family

## 2015-08-17 DIAGNOSIS — R928 Other abnormal and inconclusive findings on diagnostic imaging of breast: Secondary | ICD-10-CM

## 2015-08-23 ENCOUNTER — Ambulatory Visit
Admission: RE | Admit: 2015-08-23 | Discharge: 2015-08-23 | Disposition: A | Discharge status: Home / Self Care | Payer: 59 | Source: Ambulatory Visit | Attending: Family | Admitting: Family

## 2015-08-23 DIAGNOSIS — R928 Other abnormal and inconclusive findings on diagnostic imaging of breast: Secondary | ICD-10-CM

## 2015-08-30 ENCOUNTER — Ambulatory Visit
Admission: RE | Admit: 2015-08-30 | Discharge: 2015-08-30 | Disposition: A | Discharge status: Home / Self Care | Payer: 59 | Source: Ambulatory Visit | Attending: Family | Admitting: Family

## 2015-08-30 ENCOUNTER — Ambulatory Visit: Admission: RE | Admit: 2015-08-30 | Payer: 59 | Source: Ambulatory Visit

## 2016-08-13 ENCOUNTER — Encounter: Payer: 59 | Admitting: Family

## 2016-08-14 ENCOUNTER — Encounter: Payer: Self-pay | Admitting: Family

## 2016-08-19 ENCOUNTER — Encounter: Payer: Self-pay | Admitting: Family

## 2016-08-19 ENCOUNTER — Ambulatory Visit (INDEPENDENT_AMBULATORY_CARE_PROVIDER_SITE_OTHER): Payer: BLUE CROSS/BLUE SHIELD | Admitting: Family

## 2016-08-19 VITALS — BP 129/70 | HR 78 | Temp 97.8°F | Resp 16 | Ht 68.0 in | Wt 132.0 lb

## 2016-08-19 DIAGNOSIS — Z0000 Encounter for general adult medical examination without abnormal findings: Secondary | ICD-10-CM

## 2016-08-19 LAB — CBC WITH DIFFERENTIAL/PLATELET
BASOS PCT: 0.8 % (ref 0.0–3.0)
Basophils Absolute: 0 10*3/uL (ref 0.0–0.1)
EOS PCT: 1.9 % (ref 0.0–5.0)
Eosinophils Absolute: 0.1 10*3/uL (ref 0.0–0.7)
HEMATOCRIT: 36.3 % (ref 36.0–46.0)
Hemoglobin: 12.2 g/dL (ref 12.0–15.0)
LYMPHS PCT: 22.8 % (ref 12.0–46.0)
Lymphs Abs: 1.2 10*3/uL (ref 0.7–4.0)
MCHC: 33.6 g/dL (ref 30.0–36.0)
MCV: 89.5 fl (ref 78.0–100.0)
MONOS PCT: 5.5 % (ref 3.0–12.0)
Monocytes Absolute: 0.3 10*3/uL (ref 0.1–1.0)
NEUTROS ABS: 3.7 10*3/uL (ref 1.4–7.7)
Neutrophils Relative %: 69 % (ref 43.0–77.0)
PLATELETS: 125 10*3/uL — AB (ref 150.0–400.0)
RBC: 4.05 Mil/uL (ref 3.87–5.11)
RDW: 13.7 % (ref 11.5–15.5)
WBC: 5.4 10*3/uL (ref 4.0–10.5)

## 2016-08-19 NOTE — Patient Instructions (Signed)
Please complete lab work prior to leaving. Continue healthy diet and exercise. Schedule mammogram on the first floor.

## 2016-08-19 NOTE — Progress Notes (Addendum)
Subjective:    Patient ID: Debra Murphy, female    DOB: [birth year], 60 y.o.   MRN: [id number]  HPI  Patient presents today for complete physical.  Immunizations: tdap and zoster up to date. Declines flu shot Diet: healthy Exercise: walks 3 days a week.   Colonoscopy: 06/17/12 normal. Due 2023 Dexa: 08/03/13- osteopenia (declines due to cost) Pap Smear: 08/02/14- normal per patient.  Mammogram: due after 08/29/16.  Vision:  Up to date (goes every 6 months)  Continues to follow with dermatology due to melanoma hx.    Review of Systems  Constitutional: Negative for unexpected weight change.  HENT: Negative for rhinorrhea.   Eyes: Negative for visual disturbance.  Respiratory: Negative for cough and shortness of breath.   Cardiovascular: Negative for chest pain.  Gastrointestinal: Negative for blood in stool, constipation and diarrhea.  Genitourinary: Negative for dysuria, frequency and hematuria.  Musculoskeletal: Negative for arthralgias and myalgias.  Neurological: Negative for headaches.  Hematological: Negative for adenopathy.  Psychiatric/Behavioral:       Denies depression/anxiety   Past Medical History:  Diagnosis Date  . Abnormal EKG    May 13, 2012  . Atrial fibrillation [facility])    New diagnosis, asymptomatic, may 22nd, 2013  . Carotid bruit    High-pitched sound right neck,, Doppler, October, 2013 completely normal  . Ejection fraction    EF 55-65%, echo, May 19, 2012  . History of chicken pox   . History of migraine    rare migraines  . Melanoma ([place])    Melanoma removed from the neck     Social History   Social History  . Marital status: Married    Spouse name: N/A  . Number of children: 2  . Years of education: N/A   Occupational History  . Not on file.   Social History Main Topics  . Smoking status: Never Smoker  . Smokeless tobacco: Never Used  . Alcohol use Yes     Comment: social  . Drug use: Unknown  . Sexual activity: Not on file    Other Topics Concern  . Not on file   Social History Narrative   Regular exercise:  Walks 3 x weekly   Non smoker   Volunteers at [employer] and [employer]   Complete college   Married for 33 yrs   2 daughters- one in [place] and 1 in [place]- no grandchildren   Extended family on [place]- came to [place]   No pets   Enjoys reading, yard work, Holiday representative.                  Past Surgical History:  Procedure Laterality Date  . MELANOMA EXCISION  2012   melanoma remove from neck    Family History  Problem Relation Age of Onset  . Arthritis Mother   . Heart disease Mother     valve replacement  . Cancer Father     lung, bone, died at 71  . Heart attack Father     in his 56's  . Diabetes Neg Hx   . Stroke Neg Hx     No Known Allergies  Current Outpatient Prescriptions on File Prior to Visit  Medication Sig Dispense Refill  . aspirin (BABY ASPIRIN) 81 MG chewable tablet Take 1 tablet two times a week.    . Calcium Carb-Cholecalciferol (CALCIUM 1000 + D PO) Take 1 tablet by mouth 2 (two) times daily.    . Cholecalciferol (VITAMIN D3  PO) Take 2,000 mg by mouth daily.     . Multiple Vitamins-Minerals (PRESERVISION AREDS 2) CAPS Take 2 capsules by mouth 2 (two) times daily.    Marland Kitchen SLOW RELEASE IRON 45 MG TBCR Take 1 tablet by mouth once a week.   3  . Vaginal Lubricant (REPLENS) GEL Place vaginally as needed.     No current facility-administered medications on file prior to visit.     BP 129/70 (BP Location: Right Arm, Patient Position: Sitting, Cuff Size: Normal)   Pulse 78   Temp 97.8 F (36.6 C) (Oral)   Resp 16   Ht 5\' 8"  (1.727 m)   Wt 132 lb (59.9 kg)   LMP 10/05/2009   SpO2 100%   BMI 20.07 kg/m       Objective:   Physical Exam  Physical Exam  Constitutional: She is oriented to person, place, and time. She appears well-developed and well-nourished. No distress.  HENT:  Head: Normocephalic and atraumatic.  Right Ear: Tympanic membrane and  ear canal normal.  Left Ear: Tympanic membrane and ear canal normal.  Mouth/Throat: Oropharynx is clear and moist.  Eyes: Pupils are equal, round, and reactive to light. No scleral icterus.  Neck: Normal range of motion. No thyromegaly present.  Cardiovascular: Normal rate and regular rhythm.   No murmur heard. Pulmonary/Chest: Effort normal and breath sounds normal. No respiratory distress. He has no wheezes. She has no rales. She exhibits no tenderness.  Abdominal: Soft. Bowel sounds are normal. She exhibits no distension and no mass. There is no tenderness. There is no rebound and no guarding.  Musculoskeletal: She exhibits no edema.  Lymphadenopathy:    She has no cervical adenopathy.  Neurological: She is alert and oriented to person, place, and time. She has normal patellar reflexes. She exhibits normal muscle tone. Coordination normal.  Skin: Skin is warm and dry.  Psychiatric: She has a normal mood and affect. Her behavior is normal. Judgment and thought content normal.  Breast/pelvic: deferred.         Assessment & Plan:         Assessment & Plan:  Declines EKG, requests minimal testing due to high deductible healthy plan. Labs were normal last year. Will check follow up CBC due to hx of anemia. Reports that she is only taking iron a few times a week due to GI side effects.  Advised pt to continue healthy diet and exercise. Will refer for mammogram.    Addendum- mild thrombocytopenia noted. Will repeat cbc in 2 weeks.

## 2016-08-19 NOTE — Progress Notes (Signed)
Pre visit review using our clinic review tool, if applicable. No additional management support is needed unless otherwise documented below in the visit note. 

## 2016-08-21 ENCOUNTER — Telehealth: Payer: Self-pay | Admitting: *Deleted

## 2016-08-21 DIAGNOSIS — D696 Thrombocytopenia, unspecified: Secondary | ICD-10-CM

## 2016-08-21 NOTE — Telephone Encounter (Signed)
Lab appt scheduled per 08/20/16 lab note.

## 2016-08-22 ENCOUNTER — Encounter: Payer: Self-pay | Admitting: Family

## 2016-08-29 ENCOUNTER — Ambulatory Visit (HOSPITAL_BASED_OUTPATIENT_CLINIC_OR_DEPARTMENT_OTHER)
Admission: RE | Admit: 2016-08-29 | Discharge: 2016-08-29 | Disposition: A | Discharge status: Home / Self Care | Payer: BLUE CROSS/BLUE SHIELD | Source: Ambulatory Visit | Attending: Family | Admitting: Family

## 2016-08-29 DIAGNOSIS — Z0000 Encounter for general adult medical examination without abnormal findings: Secondary | ICD-10-CM | POA: Diagnosis not present

## 2016-08-29 DIAGNOSIS — Z1231 Encounter for screening mammogram for malignant neoplasm of breast: Secondary | ICD-10-CM | POA: Diagnosis not present

## 2016-09-02 ENCOUNTER — Encounter: Payer: Self-pay | Admitting: Family

## 2016-09-02 DIAGNOSIS — R300 Dysuria: Secondary | ICD-10-CM

## 2016-09-04 ENCOUNTER — Other Ambulatory Visit (INDEPENDENT_AMBULATORY_CARE_PROVIDER_SITE_OTHER): Payer: BLUE CROSS/BLUE SHIELD

## 2016-09-04 DIAGNOSIS — R300 Dysuria: Secondary | ICD-10-CM

## 2016-09-04 DIAGNOSIS — D696 Thrombocytopenia, unspecified: Secondary | ICD-10-CM

## 2016-09-05 ENCOUNTER — Encounter: Payer: Self-pay | Admitting: Family

## 2016-09-05 LAB — URINALYSIS, ROUTINE W REFLEX MICROSCOPIC
Bilirubin Urine: NEGATIVE
Ketones, ur: NEGATIVE
Nitrite: NEGATIVE
Specific Gravity, Urine: >=1.03 — AB (ref 1.000–1.030)
TOTAL PROTEIN, URINE-UPE24: NEGATIVE
UROBILINOGEN UA: 0.2 (ref 0.0–1.0)
Urine Glucose: NEGATIVE
pH: 6 (ref 5.0–8.0)

## 2016-09-05 LAB — CBC WITH DIFFERENTIAL/PLATELET
BASOS ABS: 0.1 10*3/uL (ref 0.0–0.1)
Basophils Relative: 1 % (ref 0.0–3.0)
EOS PCT: 2.2 % (ref 0.0–5.0)
Eosinophils Absolute: 0.1 10*3/uL (ref 0.0–0.7)
HEMATOCRIT: 35.5 % — AB (ref 36.0–46.0)
HEMOGLOBIN: 11.8 g/dL — AB (ref 12.0–15.0)
LYMPHS ABS: 1.2 10*3/uL (ref 0.7–4.0)
LYMPHS PCT: 19.9 % (ref 12.0–46.0)
MCHC: 33.3 g/dL (ref 30.0–36.0)
MCV: 89.4 fl (ref 78.0–100.0)
MONOS PCT: 5.5 % (ref 3.0–12.0)
Monocytes Absolute: 0.3 10*3/uL (ref 0.1–1.0)
Neutro Abs: 4.3 10*3/uL (ref 1.4–7.7)
Neutrophils Relative %: 71.4 % (ref 43.0–77.0)
Platelets: 159 10*3/uL (ref 150.0–400.0)
RBC: 3.97 Mil/uL (ref 3.87–5.11)
RDW: 13.4 % (ref 11.5–15.5)
WBC: 6 10*3/uL (ref 4.0–10.5)

## 2016-09-06 ENCOUNTER — Other Ambulatory Visit: Payer: Self-pay | Admitting: Family

## 2016-09-06 LAB — URINE CULTURE

## 2016-09-06 MED ORDER — NITROFURANTOIN MONOHYD MACRO 100 MG PO CAPS: 100.0000 mg | ORAL_CAPSULE | Freq: Two times a day (BID) | ORAL | 0 refills | Status: DC

## 2016-09-10 ENCOUNTER — Telehealth: Payer: Self-pay | Admitting: *Deleted

## 2016-09-10 NOTE — Telephone Encounter (Signed)
Notified pt of below recommendation. Pt declines further workup. States her hemoglobin is always fine when she donates blood and she is not concerned at the level it is at now. I explained reason for additional workup and pt still declines stool testing. States her insurance has a very high deductible and she does not want additional testing at this time.

## 2016-09-10 NOTE — Telephone Encounter (Signed)
-----   Message from Debbrah Alar, NP sent at 09/07/2016  7:45 AM EDT ----- Mild anemia, please ask lab to add on serum iron, ferritin. Also, I would like her to complete IFOB. Urine culture is sensitive to the macrobid we gave her.  Other labs look good.

## 2016-09-11 NOTE — Telephone Encounter (Signed)
Noted  

## 2016-12-19 ENCOUNTER — Ambulatory Visit (INDEPENDENT_AMBULATORY_CARE_PROVIDER_SITE_OTHER): Payer: BLUE CROSS/BLUE SHIELD | Admitting: Family Medicine

## 2016-12-19 ENCOUNTER — Encounter: Payer: Self-pay | Admitting: Family Medicine

## 2016-12-19 ENCOUNTER — Telehealth: Payer: Self-pay | Admitting: Family

## 2016-12-19 VITALS — BP 140/55 | HR 84 | Temp 97.7°F | Ht 68.0 in | Wt 132.4 lb

## 2016-12-19 DIAGNOSIS — N3000 Acute cystitis without hematuria: Secondary | ICD-10-CM | POA: Diagnosis not present

## 2016-12-19 DIAGNOSIS — R300 Dysuria: Secondary | ICD-10-CM

## 2016-12-19 LAB — POC URINALSYSI DIPSTICK (AUTOMATED)
BILIRUBIN UA: NEGATIVE
Glucose, UA: NEGATIVE
Ketones, UA: NEGATIVE
LEUKOCYTES UA: NEGATIVE
NITRITE UA: POSITIVE
PH UA: 6
PROTEIN UA: NEGATIVE
RBC UA: NEGATIVE
Spec Grav, UA: >=1.03
Urobilinogen, UA: NEGATIVE

## 2016-12-19 MED ORDER — CEPHALEXIN 500 MG PO CAPS: 500.0000 mg | ORAL_CAPSULE | Freq: Two times a day (BID) | ORAL | 0 refills | Status: DC

## 2016-12-19 MED ORDER — NITROFURANTOIN MONOHYD MACRO 100 MG PO CAPS: 100.0000 mg | ORAL_CAPSULE | Freq: Two times a day (BID) | ORAL | 0 refills | Status: DC

## 2016-12-19 NOTE — Patient Instructions (Addendum)
We are going to treat you for a UTI with keflex twice a day for one week I will also culture your urine to make sure that you are on the correct antibiotic Let me know if you are not improving soon- Sooner if worse.

## 2016-12-19 NOTE — Progress Notes (Signed)
Pre visit review using our clinic review tool, if applicable. No additional management support is needed unless otherwise documented below in the visit note. 

## 2016-12-19 NOTE — Telephone Encounter (Signed)
error:[id number] ° °

## 2016-12-19 NOTE — Progress Notes (Addendum)
[place] at [facility] [address], [place], [place] [address] [phone]  Date:  12/19/2016   Name:  Debra Murphy   DOB:  08-27-1956   MRN:  [id number]  PCP:  Nance Pear., NP    Chief Complaint: Urinary Tract Infection (c/o increased urinary frequency)   History of Present Illness:  Debra Murphy is a 60 y.o. very pleasant female patient who presents with the following:  Pt of Melissa here today with concern of possible UTI She has noted sx for a few days- she tried to treat herself with hydration and cranberry juice.   Urinary frequency, slight dysuria- not severe.   No hematuria No unusual back pain, no belly pain, no vomiting or diarrhea No vaginal sx   Patient Active Problem List   Diagnosis Date Noted  . Anemia, iron deficiency 11/07/2014  . Vaginal dryness, menopausal 07/28/2013  . Routine general medical examination at a health care facility 07/28/2013  . Carotid bruit   . Ejection fraction   . General medical examination 05/13/2012  . Atrial fibrillation ([place])   . Abnormal EKG   . Osteopenia 05/05/2012  . Hx of melanoma in situ 05/05/2012    Past Medical History:  Diagnosis Date  . Abnormal EKG    May 13, 2012  . Atrial fibrillation [facility])    New diagnosis, asymptomatic, may 22nd, 2013  . Carotid bruit    High-pitched sound right neck,, Doppler, October, 2013 completely normal  . Ejection fraction    EF 55-65%, echo, May 19, 2012  . History of chicken pox   . History of migraine    rare migraines  . Melanoma ([place])    Melanoma removed from the neck    Past Surgical History:  Procedure Laterality Date  . MELANOMA EXCISION  2012   melanoma remove from neck    Social History  Substance Use Topics  . Smoking status: Never Smoker  . Smokeless tobacco: Never Used  . Alcohol use Yes     Comment: social    Family History  Problem Relation Age of Onset  . Arthritis Mother   . Heart  disease Mother     valve replacement  . Cancer Father     lung, bone, died at 29  . Heart attack Father     in his 7's  . Diabetes Neg Hx   . Stroke Neg Hx     No Known Allergies  Medication list has been reviewed and updated.  Current Outpatient Prescriptions on File Prior to Visit  Medication Sig Dispense Refill  . aspirin (BABY ASPIRIN) 81 MG chewable tablet Take 1 tablet two times a week.    . Calcium Carb-Cholecalciferol (CALCIUM 1000 + D PO) Take 1 tablet by mouth 2 (two) times daily.    . Cholecalciferol (VITAMIN D3 PO) Take 2,000 mg by mouth daily.     . Multiple Vitamins-Minerals (PRESERVISION AREDS 2) CAPS Take 2 capsules by mouth 2 (two) times daily.    . Vaginal Lubricant (REPLENS) GEL Place vaginally as needed.     No current facility-administered medications on file prior to visit.     Review of Systems:  As per HPI- otherwise negative.   Physical Examination: Vitals:   12/19/16 1749  BP: (!) 140/55  Pulse: 84  Temp: 97.7 F (36.5 C)   Vitals:   12/19/16 1749  Weight: 132 lb 6.4 oz (60.1 kg)  Height: 5\' 8"  (  1.727 m)   Body mass index is 20.13 kg/m. Ideal Body Weight: Weight in (lb) to have BMI = 25: 164.1  GEN: WDWN, NAD, Non-toxic, A & O x 3, slim build, looks well HEENT: Atraumatic, Normocephalic. Neck supple. No masses, No LAD. Ears and Nose: No external deformity. CV: RRR, No M/G/R. No JVD. No thrill. No extra heart sounds. PULM: CTA B, no wheezes, crackles, rhonchi. No retractions. No resp. distress. No accessory muscle use. ABD: S, NT, ND. No rebound. No HSM.  Benign belly No CVA tenderness  EXTR: No c/c/e NEURO Normal gait.  PSYCH: Normally interactive. Conversant. Not depressed or anxious appearing.  Calm demeanor.     Assessment and Plan: Dysuria - Plan: POCT Urinalysis Dipstick (Automated), Urine culture, cephALEXin (KEFLEX) 500 MG capsule, DISCONTINUED: nitrofurantoin, macrocrystal-monohydrate, (MACROBID) 100 MG capsule  Acute  cystitis without hematuria   Here today with a likely UTI Treat with keflex (was going to use macrobid but this cost her over $40 last time) Await culture Close follow-up if not better- Sooner if worse.    Signed Lamar Blinks, MD  Results for orders placed or performed in visit on 12/19/16  Urine culture  Result Value Ref Range   Culture ESCHERICHIA COLI    Colony Count Greater than 100,000 CFU/mL    Organism ID, Bacteria ESCHERICHIA COLI       Susceptibility   Escherichia coli -  (no method available)    AMPICILLIN >=32 Resistant     AMOX/CLAVULANIC 4 Sensitive     AMPICILLIN/SULBACTAM 16 Intermediate     PIP/TAZO <=4 Sensitive     IMIPENEM <=0.25 Sensitive     CEFAZOLIN <=4 Not Reportable     CEFTRIAXONE <=1 Sensitive     CEFTAZIDIME <=1 Sensitive     CEFEPIME <=1 Sensitive     GENTAMICIN <=1 Sensitive     TOBRAMYCIN <=1 Sensitive     CIPROFLOXACIN <=0.25 Sensitive     LEVOFLOXACIN <=0.12 Sensitive     NITROFURANTOIN 64 Intermediate     TRIMETH/SULFA* >=320 Resistant      * NR=NOT REPORTABLE,SEE COMMENTORAL therapy:A cefazolin MIC of <32 predicts susceptibility to the oral agents cefaclor,cefdinir,cefpodoxime,cefprozil,cefuroxime,cephalexin,and loracarbef when used for therapy of uncomplicated UTIs due to E.coli,K.pneumomiae,and P.mirabilis. PARENTERAL therapy: A cefazolinMIC of >8 indicates resistance to parenteralcefazolin. An alternate test method must beperformed to confirm susceptibility to parenteralcefazolin.  POCT Urinalysis Dipstick (Automated)  Result Value Ref Range   Color, UA Yellow    Clarity, UA Clear    Glucose, UA Negative    Bilirubin, UA Negative    Ketones, UA Negative    Spec Grav, UA >=1.030    Blood, UA Negative    pH, UA 6.0    Protein, UA Negative    Urobilinogen, UA negative    Nitrite, UA Positive    Leukocytes, UA Negative Negative   E coli UTI, abx will cover.  Message to pt

## 2016-12-22 LAB — URINE CULTURE

## 2016-12-24 ENCOUNTER — Encounter: Payer: Self-pay | Admitting: Family Medicine

## 2017-05-23 HISTORY — PX: MELANOMA EXCISION: SHX5266

## 2017-08-22 ENCOUNTER — Other Ambulatory Visit (HOSPITAL_COMMUNITY)
Admission: RE | Admit: 2017-08-22 | Discharge: 2017-08-22 | Disposition: A | Discharge status: Home / Self Care | Payer: BLUE CROSS/BLUE SHIELD | Source: Ambulatory Visit | Attending: Family | Admitting: Family

## 2017-08-22 ENCOUNTER — Encounter: Payer: Self-pay | Admitting: Family

## 2017-08-22 ENCOUNTER — Ambulatory Visit (INDEPENDENT_AMBULATORY_CARE_PROVIDER_SITE_OTHER): Payer: BLUE CROSS/BLUE SHIELD | Admitting: Family

## 2017-08-22 VITALS — BP 130/68 | HR 74 | Temp 97.9°F | Resp 18 | Ht 69.0 in | Wt 134.0 lb

## 2017-08-22 DIAGNOSIS — Z01419 Encounter for gynecological examination (general) (routine) without abnormal findings: Secondary | ICD-10-CM | POA: Insufficient documentation

## 2017-08-22 DIAGNOSIS — Z0000 Encounter for general adult medical examination without abnormal findings: Secondary | ICD-10-CM

## 2017-08-22 DIAGNOSIS — Z23 Encounter for immunization: Secondary | ICD-10-CM | POA: Diagnosis not present

## 2017-08-22 DIAGNOSIS — M8580 Other specified disorders of bone density and structure, unspecified site: Secondary | ICD-10-CM

## 2017-08-22 DIAGNOSIS — R9431 Abnormal electrocardiogram [ECG] [EKG]: Secondary | ICD-10-CM | POA: Diagnosis not present

## 2017-08-22 DIAGNOSIS — Z0001 Encounter for general adult medical examination with abnormal findings: Secondary | ICD-10-CM | POA: Diagnosis not present

## 2017-08-22 LAB — HEPATIC FUNCTION PANEL
ALK PHOS: 73 U/L (ref 39–117)
ALT: 13 U/L (ref 0–35)
AST: 16 U/L (ref 0–37)
Albumin: 4.2 g/dL (ref 3.5–5.2)
BILIRUBIN DIRECT: 0.2 mg/dL (ref 0.0–0.3)
BILIRUBIN TOTAL: 0.7 mg/dL (ref 0.2–1.2)
TOTAL PROTEIN: 6.6 g/dL (ref 6.0–8.3)

## 2017-08-22 LAB — BASIC METABOLIC PANEL
BUN: 13 mg/dL (ref 6–23)
CALCIUM: 9.5 mg/dL (ref 8.4–10.5)
CO2: 32 mEq/L (ref 19–32)
Chloride: 106 mEq/L (ref 96–112)
Creatinine, Ser: 0.67 mg/dL (ref 0.40–1.20)
GFR: 94.38 mL/min (ref >60.00)
GLUCOSE: 90 mg/dL (ref 70–99)
POTASSIUM: 4.7 meq/L (ref 3.5–5.1)
SODIUM: 143 meq/L (ref 135–145)

## 2017-08-22 LAB — CBC WITH DIFFERENTIAL/PLATELET
BASOS ABS: 0.1 10*3/uL (ref 0.0–0.1)
Basophils Relative: 2.4 % (ref 0.0–3.0)
Eosinophils Absolute: 0.1 10*3/uL (ref 0.0–0.7)
Eosinophils Relative: 2.7 % (ref 0.0–5.0)
HEMATOCRIT: 38.1 % (ref 36.0–46.0)
HEMOGLOBIN: 12.3 g/dL (ref 12.0–15.0)
LYMPHS PCT: 23.8 % (ref 12.0–46.0)
Lymphs Abs: 1.2 10*3/uL (ref 0.7–4.0)
MCHC: 32.3 g/dL (ref 30.0–36.0)
MCV: 88.7 fl (ref 78.0–100.0)
MONO ABS: 0.3 10*3/uL (ref 0.1–1.0)
Monocytes Relative: 6.8 % (ref 3.0–12.0)
Neutro Abs: 3.1 10*3/uL (ref 1.4–7.7)
Neutrophils Relative %: 64.3 % (ref 43.0–77.0)
Platelets: 151 10*3/uL (ref 150.0–400.0)
RBC: 4.3 Mil/uL (ref 3.87–5.11)
RDW: 14.5 % (ref 11.5–15.5)
WBC: 4.8 10*3/uL (ref 4.0–10.5)

## 2017-08-22 LAB — LIPID PANEL
CHOL/HDL RATIO: 3
Cholesterol: 152 mg/dL (ref 0–200)
HDL: 60.6 mg/dL (ref >39.00)
LDL CALC: 73 mg/dL (ref 0–99)
NONHDL: 91.44
Triglycerides: 91 mg/dL (ref 0.0–149.0)
VLDL: 18.2 mg/dL (ref 0.0–40.0)

## 2017-08-22 LAB — URINALYSIS, ROUTINE W REFLEX MICROSCOPIC
Bilirubin Urine: NEGATIVE
Hgb urine dipstick: NEGATIVE
KETONES UR: NEGATIVE
Leukocytes, UA: NEGATIVE
Nitrite: NEGATIVE
PH: 8 (ref 5.0–8.0)
RBC / HPF: NONE SEEN (ref 0–?)
SPECIFIC GRAVITY, URINE: 1.015 (ref 1.000–1.030)
Total Protein, Urine: NEGATIVE
URINE GLUCOSE: NEGATIVE
UROBILINOGEN UA: 0.2 (ref 0.0–1.0)
WBC, UA: NONE SEEN (ref 0–?)

## 2017-08-22 LAB — TSH: TSH: 1.81 u[IU]/mL (ref 0.35–4.50)

## 2017-08-22 MED ORDER — ZOSTER VAC RECOMB ADJUVANTED 50 MCG/0.5ML IM SUSR: INTRAMUSCULAR | 1 refills | Status: DC

## 2017-08-22 NOTE — Patient Instructions (Signed)
Please schedule mammogram and bone density on the first floor. Complete lab work prior to leaving. Continue healthy diet and regular exercise. You can see if your pharmacy has the Shingrix (shingles vaccine) in stock for administration at the pharmacy- I have sent a prescription to your pharmacy.

## 2017-08-22 NOTE — Progress Notes (Addendum)
Subjective:    Patient ID: Debra Murphy, female    DOB: May 26, 1956, 61 y.o.   MRN: [id number]  HPI  Patient presents today for complete physical.  Immunizations:  Tetanus is due Diet: healthy Exercise: enjoys walking Colonoscopy: 6/13- normal 10 year follow up Dexa: 8/14 Pap Smear: 8/13 Mammogram: due    Review of Systems  Constitutional: Negative for unexpected weight change.  HENT: Negative for rhinorrhea.   Eyes: Negative for visual disturbance.  Respiratory: Negative for cough.   Gastrointestinal: Negative for constipation and diarrhea.  Genitourinary: Negative for dysuria and frequency.  Neurological: Negative for headaches.  Hematological: Negative for adenopathy.       Past Medical History:  Diagnosis Date  . Abnormal EKG    May 13, 2012  . Atrial fibrillation [facility])    New diagnosis, asymptomatic, may 22nd, 2013  . Carotid bruit    High-pitched sound right neck,, Doppler, October, 2013 completely normal  . Ejection fraction    EF 55-65%, echo, May 19, 2012  . History of chicken pox   . History of migraine    rare migraines  . Melanoma ([place])    Melanoma removed from the neck     Social History   Social History  . Marital status: Married    Spouse name: N/A  . Number of children: 2  . Years of education: N/A   Occupational History  . Not on file.   Social History Main Topics  . Smoking status: Never Smoker  . Smokeless tobacco: Never Used  . Alcohol use Yes     Comment: social  . Drug use: Unknown  . Sexual activity: Not on file   Other Topics Concern  . Not on file   Social History Narrative   Regular exercise:  Walks 3 x weekly   Non smoker   Volunteers at [employer] and [employer]   Complete college   Married for 33 yrs   2 daughters- one in [place] and 1 in [place]- no grandchildren   Extended family on [place]- came to [place]   No pets   Enjoys reading, yard work, Holiday representative.                  Past Surgical  History:  Procedure Laterality Date  . MELANOMA EXCISION  2012   melanoma remove from neck    Family History  Problem Relation Age of Onset  . Arthritis Mother   . Heart disease Mother        valve replacement  . Cancer Father        lung, bone, died at 65  . Heart attack Father        in his 26's  . Diabetes Neg Hx   . Stroke Neg Hx     No Known Allergies  Current Outpatient Prescriptions on File Prior to Visit  Medication Sig Dispense Refill  . aspirin (BABY ASPIRIN) 81 MG chewable tablet Take 1 tablet two times a week.    . Calcium Carb-Cholecalciferol (CALCIUM 1000 + D PO) Take 1 tablet by mouth 2 (two) times daily.    . Cholecalciferol (VITAMIN D3 PO) Take 2,000 mg by mouth daily.     . Multiple Vitamins-Minerals (PRESERVISION AREDS 2) CAPS Take 2 capsules by mouth 2 (two) times daily.    . Vaginal Lubricant (REPLENS) GEL Place vaginally as needed.     No current facility-administered medications on file prior to visit.     BP  130/68   Pulse 74   Temp 97.9 F (36.6 C) (Oral)   Resp 18   Ht 5\' 9"  (1.753 m)   Wt 134 lb (60.8 kg)   LMP 10/05/2009   SpO2 99%   BMI 19.79 kg/m    Objective:   Physical Exam  Physical Exam  Constitutional: She is oriented to person, place, and time. She appears well-developed and well-nourished. No distress.  HENT:  Head: Normocephalic and atraumatic.  Right Ear: Tympanic membrane and ear canal normal.  Left Ear: Tympanic membrane and ear canal normal.  Mouth/Throat: Oropharynx is clear and moist.  Eyes: Pupils are equal, round, and reactive to light. No scleral icterus.  Neck: Normal range of motion. No thyromegaly present.  Cardiovascular: Normal rate and regular rhythm.   No murmur heard. Pulmonary/Chest: Effort normal and breath sounds normal. No respiratory distress. He has no wheezes. She has no rales. She exhibits no tenderness.  Abdominal: Soft. Bowel sounds are normal. She exhibits no distension and no mass. There is  no tenderness. There is no rebound and no guarding.  Musculoskeletal: She exhibits no edema.  Lymphadenopathy:    She has no cervical adenopathy.  Neurological: She is alert and oriented to person, place, and time. She has normal patellar reflexes. She exhibits normal muscle tone. Coordination normal.  Skin: Skin is warm and dry.  Psychiatric: She has a normal mood and affect. Her behavior is normal. Judgment and thought content normal.  Breasts: Examined lying Right: Without masses, retractions, discharge or axillary adenopathy.  Left: Without masses, retractions, discharge or axillary adenopathy.  Inguinal/mons: Normal without inguinal adenopathy  External genitalia: Normal  BUS/Urethra/Skene's glands: Normal  Bladder: Normal  Vagina: atrophic Cervix: Normal  Uterus: normal in size, shape and contour. Midline and mobile  Adnexa/parametria:  Rt: Without masses or tenderness.  Lt: Without masses or tenderness.  Anus and perineum: Normal            Assessment & Plan:   Preventative care- encouraged pt to continue healthy diet, regular exercise. Refer for dexa, mammogram. Obtain routine labs. Td booster today.       Abnormal EKG-  EKG is personally reviewed and compared to previous. Notes new TWI in lead I. Will refer for ETT for further evaluation.  Assessment & Plan:

## 2017-08-25 ENCOUNTER — Telehealth: Payer: Self-pay | Admitting: Family

## 2017-08-25 DIAGNOSIS — R9431 Abnormal electrocardiogram [ECG] [EKG]: Secondary | ICD-10-CM

## 2017-08-25 NOTE — Telephone Encounter (Signed)
Please let pt know that I reviewed her EKG done at her physical. It looks a bit different from her previous. I would like her to do an exercise stress test please.

## 2017-08-26 NOTE — Telephone Encounter (Signed)
Patient returning call best # [phone] (H)

## 2017-08-26 NOTE — Telephone Encounter (Signed)
Left message with pt's spouse to have pt return my call.

## 2017-08-27 NOTE — Telephone Encounter (Signed)
Notified pt of below. She declines further testing and states "there is nothing wrong with my heart". Pt feels that fasting or non-fasting status interferes with results. I advised pt that fasting status has nothing to do with EKG result as this is checking her heart. Pt still declines.  Please advise?

## 2017-08-28 LAB — CYTOLOGY - PAP
Diagnosis: NEGATIVE
HPV (WINDOPATH): NOT DETECTED

## 2017-08-28 NOTE — Telephone Encounter (Signed)
Noted  

## 2017-09-02 ENCOUNTER — Telehealth: Payer: Self-pay | Admitting: Family

## 2017-09-02 NOTE — Telephone Encounter (Signed)
See my chart message

## 2017-09-02 NOTE — Telephone Encounter (Signed)
See letter.

## 2017-09-04 ENCOUNTER — Ambulatory Visit (HOSPITAL_BASED_OUTPATIENT_CLINIC_OR_DEPARTMENT_OTHER)
Admission: RE | Admit: 2017-09-04 | Discharge: 2017-09-04 | Disposition: A | Discharge status: Home / Self Care | Payer: BLUE CROSS/BLUE SHIELD | Source: Ambulatory Visit | Attending: Family | Admitting: Family

## 2017-09-04 DIAGNOSIS — Z13820 Encounter for screening for osteoporosis: Secondary | ICD-10-CM | POA: Diagnosis present

## 2017-09-04 DIAGNOSIS — M8588 Other specified disorders of bone density and structure, other site: Secondary | ICD-10-CM | POA: Diagnosis not present

## 2017-09-04 DIAGNOSIS — Z780 Asymptomatic menopausal state: Secondary | ICD-10-CM | POA: Diagnosis not present

## 2017-09-04 DIAGNOSIS — Z1231 Encounter for screening mammogram for malignant neoplasm of breast: Secondary | ICD-10-CM | POA: Insufficient documentation

## 2017-09-04 DIAGNOSIS — M8580 Other specified disorders of bone density and structure, unspecified site: Secondary | ICD-10-CM

## 2017-09-04 DIAGNOSIS — Z0000 Encounter for general adult medical examination without abnormal findings: Secondary | ICD-10-CM

## 2017-09-05 ENCOUNTER — Encounter: Payer: Self-pay | Admitting: Family

## 2017-09-11 ENCOUNTER — Ambulatory Visit (INDEPENDENT_AMBULATORY_CARE_PROVIDER_SITE_OTHER): Payer: BLUE CROSS/BLUE SHIELD | Admitting: Family Medicine

## 2017-09-11 VITALS — BP 124/54 | HR 81 | Temp 98.0°F | Ht 68.0 in | Wt 136.2 lb

## 2017-09-11 DIAGNOSIS — R310 Gross hematuria: Secondary | ICD-10-CM

## 2017-09-11 DIAGNOSIS — R300 Dysuria: Secondary | ICD-10-CM | POA: Diagnosis not present

## 2017-09-11 LAB — POC URINALSYSI DIPSTICK (AUTOMATED)
BILIRUBIN UA: NEGATIVE
GLUCOSE UA: NEGATIVE
Ketones, UA: NEGATIVE
NITRITE UA: NEGATIVE
PH UA: 6 (ref 5.0–8.0)
SPEC GRAV UA: 1.02 (ref 1.010–1.025)
UROBILINOGEN UA: 0.2 U/dL

## 2017-09-11 MED ORDER — CEPHALEXIN 500 MG PO CAPS: 500.0000 mg | ORAL_CAPSULE | Freq: Two times a day (BID) | ORAL | 0 refills | Status: DC

## 2017-09-11 NOTE — Progress Notes (Signed)
[name] at [facility] [address] [place], [place] [address] [phone]  Date:  09/11/2017   Name:  Debra Murphy   DOB:  26-Aug-1956   MRN:  [id number]  PCP:  Debbrah Alar, NP    Chief Complaint: Urinary Tract Infection (c/o increased urinary frequency and dysuria, increased urgency and occ dizziness x 2 days. )   History of Present Illness:  Debra Murphy is a 61 y.o. very pleasant female patient who presents with the following:  Pt of [name], PA-C, here today with concern of a UTI She does have history of A fib, menanoma   She has noted sx for a couple of days- urinary frequency,burning, she did notice blood in her urine yesterday No vomiting, fever.  She has felt a little nauseated- thought that she might be dehydrated No back pain   Pt reports that cardiology did not think she was actually in a fib- she does not take any blood thinners except for asa twice a week  Patient Active Problem List   Diagnosis Date Noted  . Anemia, iron deficiency 11/07/2014  . Vaginal dryness, menopausal 07/28/2013  . Routine general medical examination at a health care facility 07/28/2013  . Carotid bruit   . Ejection fraction   . General medical examination 05/13/2012  . Atrial fibrillation ([place])   . Abnormal EKG   . Osteopenia 05/05/2012  . Hx of melanoma in situ 05/05/2012    Past Medical History:  Diagnosis Date  . Abnormal EKG    May 13, 2012  . Atrial fibrillation [facility])    New diagnosis, asymptomatic, may 22nd, 2013  . Carotid bruit    High-pitched sound right neck,, Doppler, October, 2013 completely normal  . Ejection fraction    EF 55-65%, echo, May 19, 2012  . History of chicken pox   . History of migraine    rare migraines  . Melanoma ([place])    Melanoma removed from the neck    Past Surgical History:  Procedure Laterality Date  . MELANOMA EXCISION  2012   melanoma remove from neck    Social  History  Substance Use Topics  . Smoking status: Never Smoker  . Smokeless tobacco: Never Used  . Alcohol use Yes     Comment: social    Family History  Problem Relation Age of Onset  . Arthritis Mother   . Heart disease Mother        valve replacement  . Cancer Father        lung, bone, died at 92  . Heart attack Father        in his 36's  . Diabetes Neg Hx   . Stroke Neg Hx     No Known Allergies  Medication list has been reviewed and updated.  Current Outpatient Prescriptions on File Prior to Visit  Medication Sig Dispense Refill  . aspirin (BABY ASPIRIN) 81 MG chewable tablet Take 1 tablet two times a week.    . Calcium Carb-Cholecalciferol (CALCIUM 1000 + D PO) Take 1 tablet by mouth 2 (two) times daily.    . Cholecalciferol (VITAMIN D3 PO) Take 2,000 mg by mouth daily.     . Multiple Vitamins-Minerals (PRESERVISION AREDS 2) CAPS Take 2 capsules by mouth 2 (two) times daily.    . Vaginal Lubricant (REPLENS) GEL Place vaginally as needed.    . Zoster Vac Recomb Adjuvanted [facility]) injection One injection IM now  and then repeat in 2-6 onths (Patient not taking: Reported on 09/11/2017) 0.5 mL 1   No current facility-administered medications on file prior to visit.     Review of Systems:  As per HPI- otherwise negative. BP Readings from Last 3 Encounters:  09/11/17 (!) 124/54  08/22/17 130/68  12/19/16 (!) 140/55     Physical Examination: Vitals:   09/11/17 0859  BP: (!) 124/54  Pulse: 81  Temp: 98 F (36.7 C)   Vitals:   09/11/17 0859  Weight: 136 lb 3.2 oz (61.8 kg)  Height: 5\' 8"  (1.727 m)   Body mass index is 20.71 kg/m. Ideal Body Weight: Weight in (lb) to have BMI = 25: 164.1  GEN: WDWN, NAD, Non-toxic, A & O x 3, normal weight, looks well HEENT: Atraumatic, Normocephalic. Neck supple. No masses, No LAD. Ears and Nose: No external deformity. CV: RRR, No M/G/R. No JVD. No thrill. No extra heart sounds. PULM: CTA B, no wheezes, crackles,  rhonchi. No retractions. No resp. distress. No accessory muscle use. ABD: S, NT, ND, +BS. No rebound. No HSM. EXTR: No c/c/e NEURO Normal gait.  PSYCH: Normally interactive. Conversant. Not depressed or anxious appearing.  Calm demeanor.    Assessment and Plan: Dysuria - Plan: POCT Urinalysis Dipstick (Automated), Urine Culture, cephALEXin (KEFLEX) 500 MG capsule  Gross hematuria  Here today with recurrent UTI sx Assuming urine culture is negative, this will explain blood in urine  Will start on treatment with keflex which was effective for her last time She can use pyridium OTC as needed Follow-up pending culture She is cautioned to alert me if not feeling better in the next couple of days  Signed Lamar Blinks, MD

## 2017-09-11 NOTE — Patient Instructions (Addendum)
It was good to see you today, I hope that you feel better soon Let me know if you are not improved in the next couple of days- Sooner if worse.  We are going to treat your UTI with keflex twice a day for one week- I'll be in touch with your culture when it comes in

## 2017-09-14 LAB — URINE CULTURE
MICRO NUMBER: 81041581
SPECIMEN QUALITY: ADEQUATE

## 2018-06-01 ENCOUNTER — Ambulatory Visit (INDEPENDENT_AMBULATORY_CARE_PROVIDER_SITE_OTHER): Payer: BLUE CROSS/BLUE SHIELD | Admitting: Medical

## 2018-06-01 ENCOUNTER — Encounter: Payer: Self-pay | Admitting: Medical

## 2018-06-01 VITALS — BP 146/71 | HR 73 | Temp 97.8°F | Resp 16 | Ht 68.0 in | Wt 133.2 lb

## 2018-06-01 DIAGNOSIS — N3001 Acute cystitis with hematuria: Secondary | ICD-10-CM

## 2018-06-01 DIAGNOSIS — R300 Dysuria: Secondary | ICD-10-CM

## 2018-06-01 LAB — POC URINALSYSI DIPSTICK (AUTOMATED)
Bilirubin, UA: NEGATIVE
GLUCOSE UA: NEGATIVE
Ketones, UA: NEGATIVE
NITRITE UA: NEGATIVE
Protein, UA: POSITIVE — AB
Spec Grav, UA: >=1.03 — AB (ref 1.010–1.025)
UROBILINOGEN UA: NEGATIVE U/dL — AB
pH, UA: 6 (ref 5.0–8.0)

## 2018-06-01 MED ORDER — CEPHALEXIN 500 MG PO CAPS: 500.0000 mg | ORAL_CAPSULE | Freq: Two times a day (BID) | ORAL | 0 refills | Status: DC

## 2018-06-01 MED FILL — CEPHALEXIN 500 MG CAPSULE: 500 | 7 days supply | Qty: 14 | Fill #0

## 2018-06-01 NOTE — Progress Notes (Signed)
Subjective:    Patient ID: Debra Murphy, female    DOB: Nov 20, 1956, 62 y.o.   MRN: [id number]  HPI  Pt in today reporting urinary symptoms for about one week. She has been drinking cranberry juice. Seems to help but not completely as symptoms still persist.  Dysuria- yes Frequent urination-yes Hesitancy-yes Suprapubic pressure-mild. Feverno chills-no Nausea-no Vomiting-no CVA pain-no History of UTI-yes Gross hematuria-pt did see clot in urine one week ago.   Review of Systems  Constitutional: Negative for chills, fatigue and fever.  Respiratory: Negative for cough, chest tightness, shortness of breath and wheezing.   Cardiovascular: Negative for chest pain and palpitations.  Gastrointestinal: Negative for abdominal pain.  Genitourinary: Positive for dysuria and urgency. Negative for pelvic pain and vaginal pain.  Musculoskeletal: Negative for back pain.  Neurological: Negative for dizziness and headaches.  Hematological: Negative for adenopathy. Does not bruise/bleed easily.  Psychiatric/Behavioral: Negative for behavioral problems and confusion.    Past Medical History:  Diagnosis Date  . Abnormal EKG    May 13, 2012  . Atrial fibrillation [facility])    New diagnosis, asymptomatic, may 22nd, 2013  . Carotid bruit    High-pitched sound right neck,, Doppler, October, 2013 completely normal  . Ejection fraction    EF 55-65%, echo, May 19, 2012  . History of chicken pox   . History of migraine    rare migraines  . Melanoma ([place])    Melanoma removed from the neck     Social History   Socioeconomic History  . Marital status: Married    Spouse name: Not on file  . Number of children: 2  . Years of education: Not on file  . Highest education level: Not on file  Occupational History  . Not on file  Social Needs  . Financial resource strain: Not on file  . Food insecurity:    Worry: Not on file    Inability: Not on file  . Transportation needs:    Medical: Not  on file    Non-medical: Not on file  Tobacco Use  . Smoking status: Never Smoker  . Smokeless tobacco: Never Used  Substance and Sexual Activity  . Alcohol use: Yes    Comment: social  . Drug use: Not on file  . Sexual activity: Not on file  Lifestyle  . Physical activity:    Days per week: Not on file    Minutes per session: Not on file  . Stress: Not on file  Relationships  . Social connections:    Talks on phone: Not on file    Gets together: Not on file    Attends religious service: Not on file    Active member of club or organization: Not on file    Attends meetings of clubs or organizations: Not on file    Relationship status: Not on file  . Intimate partner violence:    Fear of current or ex partner: Not on file    Emotionally abused: Not on file    Physically abused: Not on file    Forced sexual activity: Not on file  Other Topics Concern  . Not on file  Social History Narrative   Regular exercise:  Walks 3 x weekly   Non smoker   Volunteers at [employer] and [employer]   Complete college   Married for 33 yrs   2 daughters- one in [place] and 1 in [place]- no grandchildren   Extended family on [place]- came  to [place]   No pets   Enjoys reading, yard work, Holiday representative.               Past Surgical History:  Procedure Laterality Date  . MELANOMA EXCISION  2012   melanoma remove from neck    Family History  Problem Relation Age of Onset  . Arthritis Mother   . Heart disease Mother        valve replacement  . Cancer Father        lung, bone, died at 68  . Heart attack Father        in his 33's  . Diabetes Neg Hx   . Stroke Neg Hx     No Known Allergies  Current Outpatient Medications on File Prior to Visit  Medication Sig Dispense Refill  . aspirin (BABY ASPIRIN) 81 MG chewable tablet Take 1 tablet two times a week.    . Cholecalciferol (VITAMIN D3 PO) Take 2,000 mg by mouth daily.     . Multiple Vitamins-Minerals (PRESERVISION AREDS  2) CAPS Take 2 capsules by mouth 2 (two) times daily.    . Vaginal Lubricant (REPLENS) GEL Place vaginally as needed.    . Zoster Vac Recomb Adjuvanted [facility]) injection One injection IM now and then repeat in 2-6 onths 0.5 mL 1   No current facility-administered medications on file prior to visit.     BP (!) 146/71   Pulse 73   Temp 97.8 F (36.6 C) (Oral)   Resp 16   Ht 5\' 8"  (1.727 m)   Wt 133 lb 3.2 oz (60.4 kg)   LMP 10/05/2009   SpO2 100%   BMI 20.25 kg/m       Objective:   Physical Exam  General Appearance- Not in acute distress.  HEENT Eyes- Scleraeral/Conjuntiva-bilat- Not Yellow. Mouth & Throat- Normal.  Chest and Lung Exam Auscultation: Breath sounds:-Normal. Adventitious sounds:- No Adventitious sounds.  Cardiovascular Auscultation:Rythm - Regular. Heart Sounds -Normal heart sounds.  Abdomen Inspection:-Inspection Normal.  Palpation/Perucssion: Palpation and Percussion of the abdomen reveal- Non Tender, No Rebound tenderness, No rigidity(Guarding) and No Palpable abdominal masses.  Liver:-Normal.  Spleen:- Normal.   Back- no cva tenderness.      Assessment & Plan:  Your appear to have a urinary tract infection. I am prescribing keflex antibiotic for the probable infection. Hydrate well. I am sending out a urine culture. During the interim if your signs and symptoms worsen rather than improving please notify us. We will notify your when the culture results are back.  Discussed pyridium use. After discussion declined.  Follow up in 7 days or as needed.

## 2018-06-01 NOTE — Patient Instructions (Addendum)
Your appear to have a urinary tract infection. I am prescribing keflex  antibiotic for the probable infection. Hydrate well. I am sending out a urine culture. During the interim if your signs and symptoms worsen rather than improving please notify us. We will notify your when the culture results are back.  Discussed pyridium use. After discussion declined.  Follow up in 7 days or as needed.

## 2018-06-03 LAB — URINE CULTURE
MICRO NUMBER:: 90693538
SPECIMEN QUALITY:: ADEQUATE

## 2018-08-07 ENCOUNTER — Encounter: Payer: Self-pay | Admitting: Family

## 2018-08-07 DIAGNOSIS — C439 Malignant melanoma of skin, unspecified: Secondary | ICD-10-CM | POA: Insufficient documentation

## 2018-08-25 ENCOUNTER — Encounter: Payer: BLUE CROSS/BLUE SHIELD | Admitting: Family

## 2018-10-12 ENCOUNTER — Other Ambulatory Visit (HOSPITAL_BASED_OUTPATIENT_CLINIC_OR_DEPARTMENT_OTHER): Payer: Self-pay | Admitting: Family Medicine

## 2018-10-12 DIAGNOSIS — Z1231 Encounter for screening mammogram for malignant neoplasm of breast: Secondary | ICD-10-CM

## 2018-10-15 ENCOUNTER — Ambulatory Visit (HOSPITAL_BASED_OUTPATIENT_CLINIC_OR_DEPARTMENT_OTHER)
Admission: RE | Admit: 2018-10-15 | Discharge: 2018-10-15 | Disposition: A | Discharge status: Home / Self Care | Payer: BLUE CROSS/BLUE SHIELD | Source: Ambulatory Visit | Attending: Family Medicine | Admitting: Family Medicine

## 2018-10-15 DIAGNOSIS — Z1231 Encounter for screening mammogram for malignant neoplasm of breast: Secondary | ICD-10-CM | POA: Insufficient documentation

## 2019-09-23 ENCOUNTER — Other Ambulatory Visit (HOSPITAL_BASED_OUTPATIENT_CLINIC_OR_DEPARTMENT_OTHER): Payer: Self-pay | Admitting: Family Medicine

## 2019-09-23 DIAGNOSIS — Z1231 Encounter for screening mammogram for malignant neoplasm of breast: Secondary | ICD-10-CM

## 2019-10-18 ENCOUNTER — Encounter (HOSPITAL_BASED_OUTPATIENT_CLINIC_OR_DEPARTMENT_OTHER): Payer: Self-pay

## 2019-10-18 ENCOUNTER — Other Ambulatory Visit: Payer: Self-pay

## 2019-10-18 ENCOUNTER — Ambulatory Visit (HOSPITAL_BASED_OUTPATIENT_CLINIC_OR_DEPARTMENT_OTHER)
Admission: RE | Admit: 2019-10-18 | Discharge: 2019-10-18 | Disposition: A | Discharge status: Home / Self Care | Payer: Medicare HMO | Source: Ambulatory Visit | Attending: Family Medicine | Admitting: Family Medicine

## 2019-10-18 DIAGNOSIS — Z1231 Encounter for screening mammogram for malignant neoplasm of breast: Secondary | ICD-10-CM | POA: Insufficient documentation

## 2020-01-03 IMAGING — MG DIGITAL SCREENING BILAT W/ TOMO
6 of 12 series · 6 of 36 positions shown · non-contrast
Comparison: Previous exam(s).

CLINICAL DATA: Screening.

EXAM:
DIGITAL SCREENING BILATERAL MAMMOGRAM WITH TOMO AND CAD

[L XCCL synth-2D]
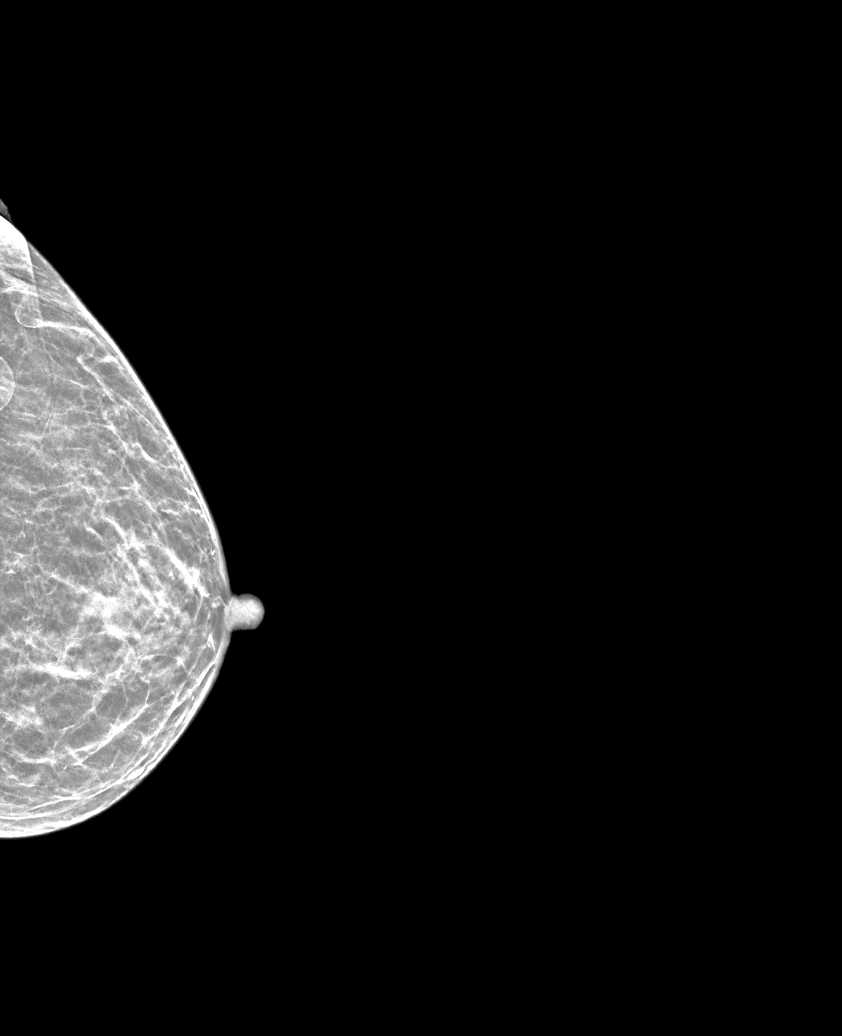

[R CC synth-2D]
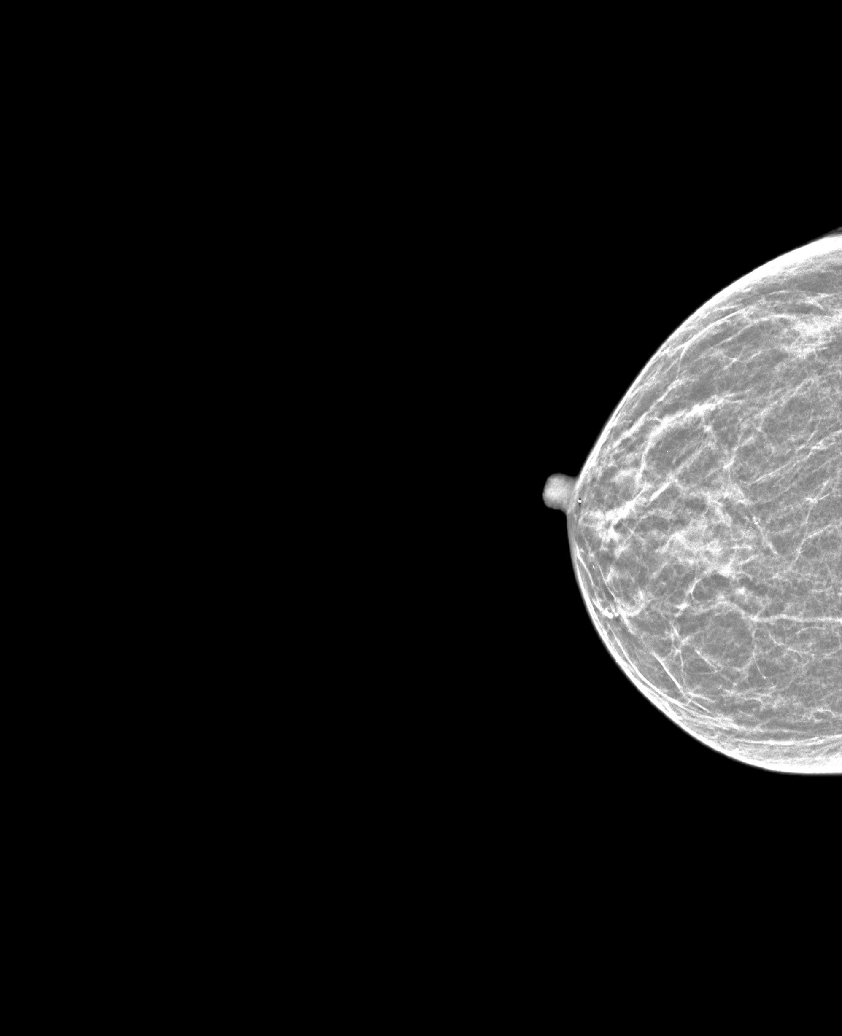

[L MLO synth-2D (1 of 2)]
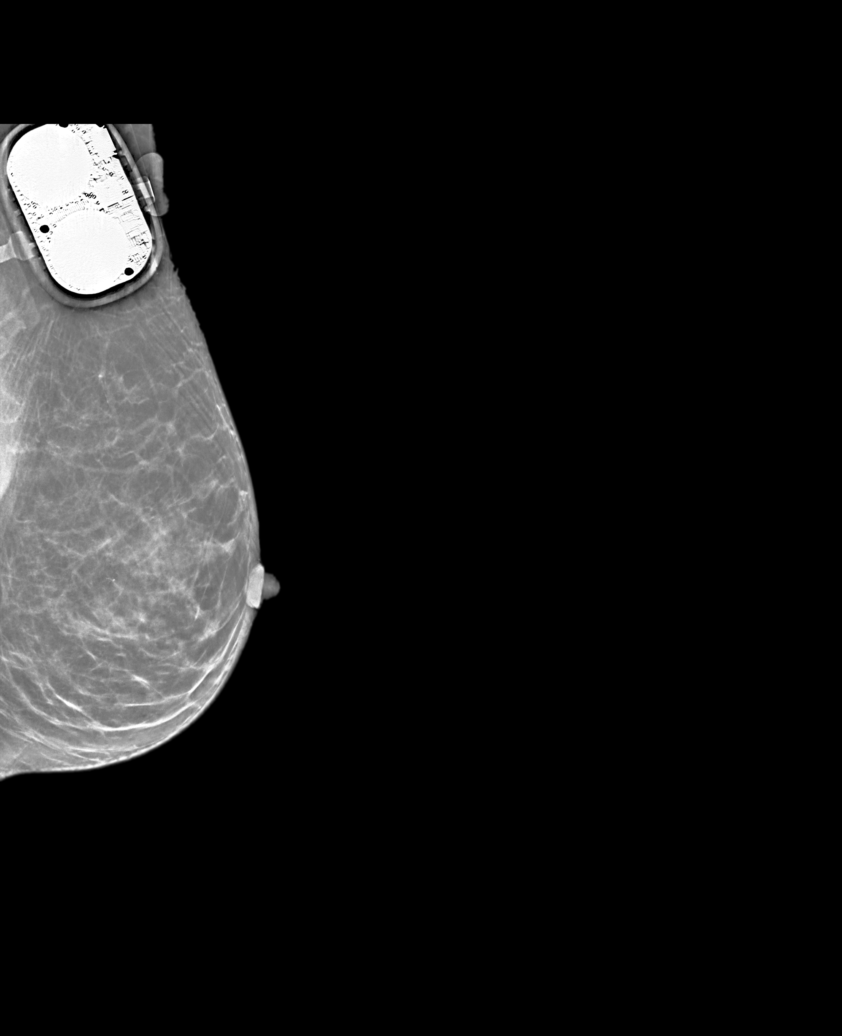

[R MLO synth-2D]
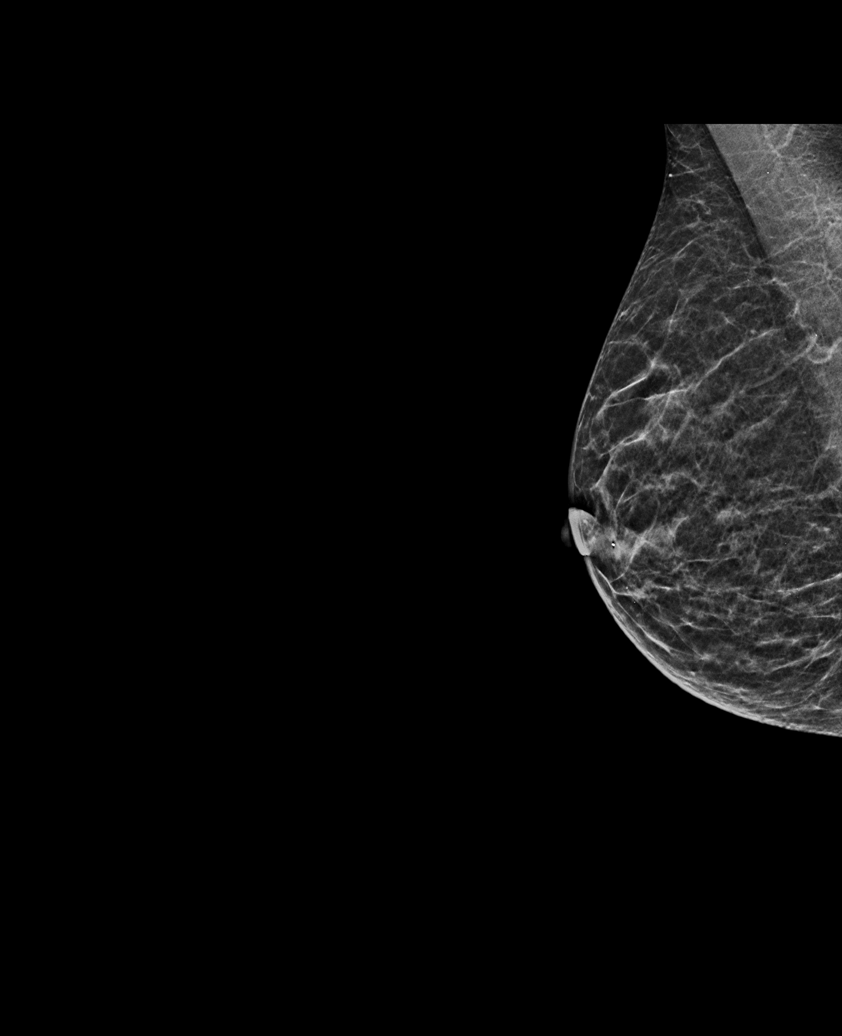

[L CC synth-2D]
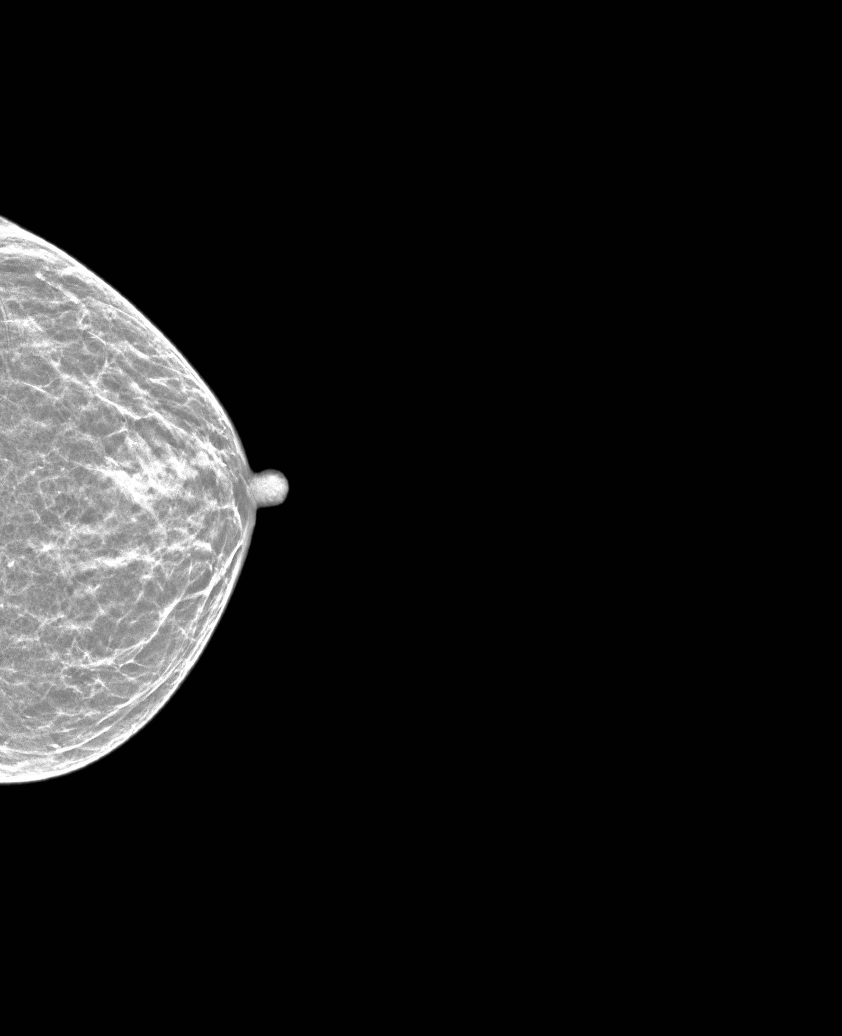

[L MLO synth-2D (2 of 2)]
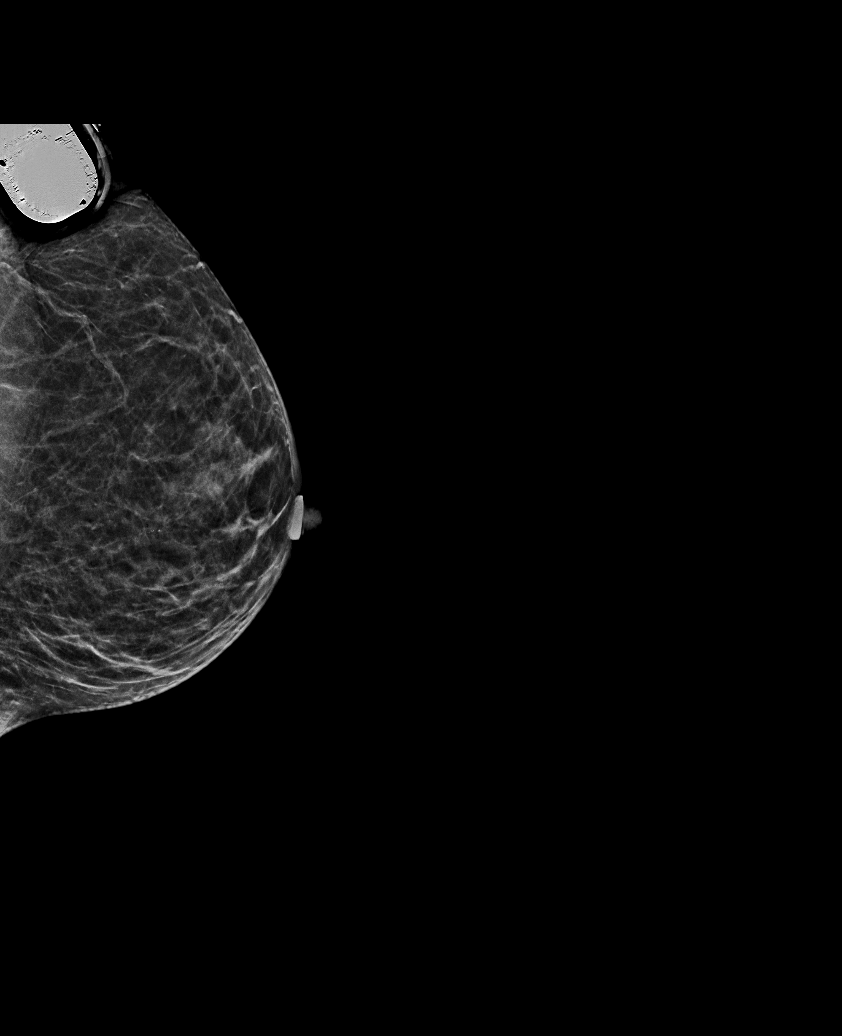

[6 of 36 positions shown; findings below may reference images not displayed]

ACR Breast Density Category b: There are scattered areas of
fibroglandular density.
FINDINGS: There are no findings suspicious for malignancy. Images were
processed with CAD.
IMPRESSION: No mammographic evidence of malignancy. A result letter of this
screening mammogram will be mailed directly to the patient.

RECOMMENDATION:
Screening mammogram in one year. (Code:CN-U-775)

BI-RADS CATEGORY  1: Negative.

## 2020-09-04 ENCOUNTER — Other Ambulatory Visit (HOSPITAL_BASED_OUTPATIENT_CLINIC_OR_DEPARTMENT_OTHER): Payer: Self-pay | Admitting: Family Medicine

## 2020-09-04 DIAGNOSIS — Z1231 Encounter for screening mammogram for malignant neoplasm of breast: Secondary | ICD-10-CM

## 2020-10-23 ENCOUNTER — Ambulatory Visit (HOSPITAL_BASED_OUTPATIENT_CLINIC_OR_DEPARTMENT_OTHER): Admission: RE | Admit: 2020-10-23 | Payer: Medicare HMO | Source: Ambulatory Visit

## 2020-12-26 ENCOUNTER — Encounter (HOSPITAL_BASED_OUTPATIENT_CLINIC_OR_DEPARTMENT_OTHER): Payer: Self-pay

## 2020-12-26 ENCOUNTER — Ambulatory Visit (HOSPITAL_BASED_OUTPATIENT_CLINIC_OR_DEPARTMENT_OTHER): Payer: Medicare HMO

## 2022-04-12 ENCOUNTER — Other Ambulatory Visit: Payer: Self-pay | Admitting: Gastroenterology

## 2022-06-19 ENCOUNTER — Encounter: Payer: Self-pay | Admitting: Gastroenterology

## 2022-07-02 ENCOUNTER — Encounter: Payer: Self-pay | Admitting: Gastroenterology

## 2022-08-01 ENCOUNTER — Ambulatory Visit: Payer: Medicare HMO | Admitting: Gastroenterology

## 2022-08-01 ENCOUNTER — Encounter: Payer: Self-pay | Admitting: Gastroenterology

## 2022-08-01 ENCOUNTER — Telehealth: Payer: Self-pay

## 2022-08-01 VITALS — BP 110/76 | HR 82 | Ht 69.0 in | Wt 130.0 lb

## 2022-08-01 DIAGNOSIS — I4891 Unspecified atrial fibrillation: Secondary | ICD-10-CM | POA: Diagnosis not present

## 2022-08-01 DIAGNOSIS — Z7901 Long term (current) use of anticoagulants: Secondary | ICD-10-CM

## 2022-08-01 DIAGNOSIS — Z1211 Encounter for screening for malignant neoplasm of colon: Secondary | ICD-10-CM

## 2022-08-01 MED ORDER — CLENPIQ 10-3.5-12 MG-GM -GM/175ML PO SOLN: 1.0000 | Freq: Once | ORAL | 0 refills | Status: AC

## 2022-08-01 NOTE — Patient Instructions (Signed)
_______________________________________________________  If you are age 68 or older, your body mass index should be between 23-30. Your Body mass index is 19.2 kg/m. If this is out of the aforementioned range listed, please consider follow up with your Primary Care Provider.  If you are age 37 or younger, your body mass index should be between 19-25. Your Body mass index is 19.2 kg/m. If this is out of the aformentioned range listed, please consider follow up with your Primary Care Provider.   ________________________________________________________  The [facility] providers would like to encourage you to use [facility] to communicate with providers for non-urgent requests or questions.  Due to long hold times on the telephone, sending your provider a message by [facility] may be a faster and more efficient way to get a response.  Please allow 48 business hours for a response.  Please remember that this is for non-urgent requests.  _______________________________________________________  Dennis Bast have been scheduled for a colonoscopy. Please follow written instructions given to you at your visit today.  Please pick up your prep supplies at the pharmacy within the next 1-3 days. If you use inhalers (even only as needed), please bring them with you on the day of your procedure.   We have sent the following medications to your pharmacy for you to pick up at your convenience: Clenpiq  You will be contaced by our office prior to your procedure for directions on holding your Eliquis  If you do not hear from our office 1 week prior to your scheduled procedure, please call [phone] to discuss.   Thank you for choosing me and [facility].  Vito Cirigliano, D.O.

## 2022-08-01 NOTE — Telephone Encounter (Signed)
Cardiac Clearance Faxed to Dr. Tommi Rumps at [phone], Ph # [phone]

## 2022-08-01 NOTE — Progress Notes (Signed)
Chief Complaint: Colon cancer screening   HPI:     Debra Murphy is a 66 y.o. female with a history of A-fib s/p pulmonary vein isolation cryoablation 01/2021 (on Eliquis), paroxysmal SVT, thrombocytopenia, migraines, presenting to the Gastroenterology Clinic for evaluation of colon cancer screening and perioperative management of anticoagulation.  No active GI symptoms, to include hematochezia, melena, change in bowel habits.  Good p.o. intake.  Weight stable.  No upper GI symptoms.  No known family history of colon cancer or related GI malignancy.  Was last seen by her Cardiologist 03/12/2022 with plan for 86-monthfollow-up.  Endoscopic History: - Colonoscopy (05/2012): Normal   Reviewed labs from 01/2022: - PLT 145 (stable from previous), otherwise normal CBC - CO2 35 (stable from previous), otherwise normal CMP  Past Medical History:  Diagnosis Date   Abnormal EKG    May 13, 2012   Arrhythmia    Atrial fibrillation ([facility]    New diagnosis, asymptomatic, may 22nd, 2013   Carotid bruit    High-pitched sound right neck,, Doppler, October, 2013 completely normal   Ejection fraction    EF 55-65%, echo, May 19, 2012   History of chicken pox    History of migraine    rare migraines   Melanoma ([facility]    Melanoma removed from the neck 2013 and recurrence on chest 08/06/18 referred for Moh's by EOrbie Hurst  UTI (urinary tract infection)      Past Surgical History:  Procedure Laterality Date   MELANOMA EXCISION  12/23/2010   melanoma remove from neck   MELANOMA EXCISION  05/2017   Family History  Problem Relation Age of Onset   Arthritis Mother    Heart disease Mother        valve replacement   Cancer Father        lung, bone, died at 711  Heart attack Father        in his 628's  Diabetes Neg Hx    Stroke Neg Hx    Social History   Tobacco Use   Smoking status: Never   Smokeless tobacco: Never  Vaping Use   Vaping Use: Never used  Substance Use  Topics   Alcohol use: Yes    Comment: social   Drug use: Not Currently   Current Outpatient Medications  Medication Sig Dispense Refill   apixaban (ELIQUIS) 5 MG TABS tablet Take 1 tablet by mouth 2 (two) times daily.     Calcium Carb-Cholecalciferol (CALCIUM + VITAMIN D3 PO) Take 2 tablets by mouth daily in the afternoon.     Cholecalciferol (VITAMIN D3 PO) Take 1 capsule by mouth daily.     metoprolol succinate (TOPROL-XL) 50 MG 24 hr tablet Take 50 mg by mouth daily.     Multiple Vitamins-Minerals (PRESERVISION AREDS 2) CAPS Take 2 capsules by mouth 2 (two) times daily.     UBRELVY 100 MG TABS Take 1 tablet by mouth as needed.     No current facility-administered medications for this visit.   Allergies  Allergen Reactions   Sorbitol Diarrhea    GAS     Review of Systems: All systems reviewed and negative except where noted in HPI.     Physical Exam:    Wt Readings from Last 3 Encounters:  08/01/22 130 lb (59 kg)  06/01/18 133 lb 3.2 oz (60.4 kg)  09/11/17 136 lb 3.2 oz (61.8 kg)  BP 110/76   Pulse 82   Ht 5' 9"  (1.753 m)   Wt 130 lb (59 kg)   LMP 10/05/2009   BMI 19.20 kg/m  Constitutional:  Pleasant, in no acute distress. Psychiatric: Normal mood and affect. Behavior is normal. Neurological: Alert and oriented to person place and time. Skin: Skin is warm and dry. No rashes noted.   ASSESSMENT AND PLAN;   1) Colon cancer screening Debra Murphy is a 66 y.o. female presenting to the Gastroenterology Clinic for continued CRC screening. No family history of CRC or related malignancies, and patient is without any active GI sxs. Discussed options for CRC screening, to include colonoscopy, FIT kit testing, Cologuard, etc, and the patient decided to proceed with an optical colonoscopy.   - Will schedule date and time for colonoscopy prior to leaving clinic today  - NPO at [place] prior to procedure  - Bowel prep ordered with plan for instruction with GI clinical  staff  2) Atrial fibrillation 3) Chronic anticoagulation -Hold Eliquis 2 days before procedure - will instruct when and how to resume after procedure. Low but real risk of cardiovascular event such as heart attack, stroke, embolism, thrombosis or ischemia/infarct of other organs off Eliquis explained and need to seek urgent help if this occurs. The patient consents to proceed. Will communicate by phone or EMR with patient's prescribing provider to confirm that holding Eliquis is reasonable in this case  4) Thrombocytopenia - Mild thrombocytopenia.  Stable on recent labs.  The indications, risks, and benefits of colonoscopy were explained to the patient in detail. Risks include but are not limited to bleeding, perforation, adverse reaction to medications, and cardiopulmonary compromise. Sequelae include but are not limited to the possibility of surgery, hospitalization, and mortality. The patient verbalized understanding and wished to proceed. All questions answered, referred for scheduling and bowel prep ordered. Further recommendations pending results of the exam.      Dominic Pea Rydell Wiegel, DO, FACG  08/01/2022, 3:36 PM   No ref. provider found

## 2022-08-02 NOTE — Telephone Encounter (Signed)
Faxed clearance to [phone] per shonna request

## 2022-08-06 NOTE — Telephone Encounter (Signed)
Spoke to [place] and he will have the doctor to sign off tomorrow he said and will fax it back 8-16 or 8-17 Personal fax [phone]

## 2022-08-08 NOTE — Telephone Encounter (Signed)
Dr Minna Merritts approved Eliquis 2 days prior and patient is made aware and voiced understanding

## 2022-08-13 ENCOUNTER — Encounter: Payer: Self-pay | Admitting: Certified Registered Nurse Anesthetist

## 2022-08-20 ENCOUNTER — Ambulatory Visit (AMBULATORY_SURGERY_CENTER): Payer: Medicare HMO | Admitting: Gastroenterology

## 2022-08-20 ENCOUNTER — Encounter: Payer: Self-pay | Admitting: Gastroenterology

## 2022-08-20 VITALS — BP 148/80 | HR 66 | Temp 97.7°F | Resp 14 | Ht 69.0 in | Wt 130.0 lb

## 2022-08-20 DIAGNOSIS — Z1211 Encounter for screening for malignant neoplasm of colon: Secondary | ICD-10-CM

## 2022-08-20 DIAGNOSIS — K5730 Diverticulosis of large intestine without perforation or abscess without bleeding: Secondary | ICD-10-CM

## 2022-08-20 DIAGNOSIS — D128 Benign neoplasm of rectum: Secondary | ICD-10-CM | POA: Diagnosis not present

## 2022-08-20 DIAGNOSIS — K6289 Other specified diseases of anus and rectum: Secondary | ICD-10-CM

## 2022-08-20 MED ORDER — SODIUM CHLORIDE 0.9 % IV SOLN: 500.0000 mL | Freq: Once | INTRAVENOUS | Status: DC

## 2022-08-20 NOTE — Progress Notes (Signed)
GASTROENTEROLOGY PROCEDURE H&P NOTE   Primary Care Physician: Patient, No Pcp Per    Reason for Procedure:  Colon cancer screening  Plan:    Colonoscopy  Patient is appropriate for endoscopic procedure(s) in the ambulatory ([place]) setting.  The nature of the procedure, as well as the risks, benefits, and alternatives were carefully and thoroughly reviewed with the patient. Ample time for discussion and questions allowed. The patient understood, was satisfied, and agreed to proceed.     HPI: Debra Murphy is a 66 y.o. female who presents for colonoscopy for evaluation of ongoing colon cancer screening.  Patient was most recently seen in the Gastroenterology Clinic on 08/01/2022.  No interval change in medical history since that appointment. Please refer to that note for full details regarding GI history and clinical presentation.   Holding Eliquis x2 days for procedure today.  Endoscopic History: - Colonoscopy (05/2012): Normal  Past Medical History:  Diagnosis Date   Abnormal EKG    May 13, 2012   Arrhythmia    Atrial fibrillation [facility])    New diagnosis, asymptomatic, may 22nd, 2013   Carotid bruit    High-pitched sound right neck,, Doppler, October, 2013 completely normal   Clotting disorder ([place])    Ejection fraction    EF 55-65%, echo, May 19, 2012   History of chicken pox    History of migraine    rare migraines   Melanoma ([place])    Melanoma removed from the neck 2013 and recurrence on chest 08/06/18 referred for Moh's by Orbie Hurst   UTI (urinary tract infection)     Past Surgical History:  Procedure Laterality Date   MELANOMA EXCISION  12/23/2010   melanoma remove from neck   MELANOMA EXCISION  05/2017    Prior to Admission medications   Medication Sig Start Date End Date Taking? Authorizing Provider  Calcium Carb-Cholecalciferol (CALCIUM + VITAMIN D3 PO) Take 2 tablets by mouth daily in the afternoon.   Yes [provider]  Cholecalciferol  (VITAMIN D3 PO) Take 1 capsule by mouth daily.   Yes [provider]  metoprolol succinate (TOPROL-XL) 50 MG 24 hr tablet Take 50 mg by mouth daily. 07/02/22  Yes [provider]  Multiple Vitamins-Minerals (PRESERVISION AREDS 2) CAPS Take 2 capsules by mouth 2 (two) times daily.   Yes [provider]  apixaban (ELIQUIS) 5 MG TABS tablet Take 1 tablet by mouth 2 (two) times daily. 03/27/22   [provider]  UBRELVY 100 MG TABS Take 1 tablet by mouth as needed. 05/17/22   [provider]    Current Outpatient Medications  Medication Sig Dispense Refill   Calcium Carb-Cholecalciferol (CALCIUM + VITAMIN D3 PO) Take 2 tablets by mouth daily in the afternoon.     Cholecalciferol (VITAMIN D3 PO) Take 1 capsule by mouth daily.     metoprolol succinate (TOPROL-XL) 50 MG 24 hr tablet Take 50 mg by mouth daily.     Multiple Vitamins-Minerals (PRESERVISION AREDS 2) CAPS Take 2 capsules by mouth 2 (two) times daily.     apixaban (ELIQUIS) 5 MG TABS tablet Take 1 tablet by mouth 2 (two) times daily.     UBRELVY 100 MG TABS Take 1 tablet by mouth as needed.     Current Facility-Administered Medications  Medication Dose Route Frequency Provider Last Rate Last Admin   0.9 %  sodium chloride infusion  500 mL Intravenous Once Cassie Shedlock V, DO        Allergies  as of 08/20/2022 - Review Complete 08/20/2022  Allergen Reaction Noted   Sorbitol Diarrhea 01/26/2021    Family History  Problem Relation Age of Onset   Arthritis Mother    Heart disease Mother        valve replacement   Cancer Father        lung, bone, died at 86   Heart attack Father        in his 33's   Diabetes Neg Hx    Stroke Neg Hx    Colon cancer Neg Hx    Rectal cancer Neg Hx     Social History   Socioeconomic History   Marital status: Married    Spouse name: Not on file   Number of children: 2   Years of education: Not on file   Highest education level: Not on file   Occupational History   Not on file  Tobacco Use   Smoking status: Never   Smokeless tobacco: Never  Vaping Use   Vaping Use: Never used  Substance and Sexual Activity   Alcohol use: Yes    Comment: social   Drug use: Not Currently   Sexual activity: Not on file  Other Topics Concern   Not on file  Social History Narrative   Regular exercise:  Walks 3 x weekly   Non smoker   Volunteers at [employer] and [employer]   Complete college   Married for 33 yrs   2 daughters- one in [place] and 1 in [place]- no grandchildren   Extended family on [place]- came to [place]   No pets   Enjoys reading, yard work, Holiday representative.              Social Determinants of Health   Financial Resource Strain: Not on file  Food Insecurity: Not on file  Transportation Needs: Not on file  Physical Activity: Not on file  Stress: Not on file  Social Connections: Not on file  Intimate Partner Violence: Not on file    Physical Exam: Vital signs in last 24 hours: '@BP'$  127/81   Pulse 84   Temp 97.7 F (36.5 C)   Ht '5\' 9"'$  (1.753 m)   Wt 130 lb (59 kg)   LMP 10/05/2009   SpO2 98%   BMI 19.20 kg/m  GEN: NAD EYE: Sclerae anicteric ENT: MMM CV: Non-tachycardic Pulm: CTA b/l GI: Soft, NT/ND NEURO:  Alert & Oriented x 3   Gerrit Heck, DO [facility]   08/20/2022 8:22 AM

## 2022-08-20 NOTE — Patient Instructions (Signed)
RESUME Apixaban (Eliquis) at prior dose tomorrow 08/21/22  Handout on polyps and diverticulosis provided   Await pathology results.   Continue current medications.    YOU HAD AN ENDOSCOPIC PROCEDURE TODAY AT [facility]:   Refer to the procedure report that was given to you for any specific questions about what was found during the examination.  If the procedure report does not answer your questions, please call your gastroenterologist to clarify.  If you requested that your care partner not be given the details of your procedure findings, then the procedure report has been included in a sealed envelope for you to review at your convenience later.  YOU SHOULD EXPECT: Some feelings of bloating in the abdomen. Passage of more gas than usual.  Walking can help get rid of the air that was put into your GI tract during the procedure and reduce the bloating. If you had a lower endoscopy (such as a colonoscopy or flexible sigmoidoscopy) you may notice spotting of blood in your stool or on the toilet paper. If you underwent a bowel prep for your procedure, you may not have a normal bowel movement for a few days.  Please Note:  You might notice some irritation and congestion in your nose or some drainage.  This is from the oxygen used during your procedure.  There is no need for concern and it should clear up in a day or so.  SYMPTOMS TO REPORT IMMEDIATELY:  Following lower endoscopy (colonoscopy or flexible sigmoidoscopy):  Excessive amounts of blood in the stool  Significant tenderness or worsening of abdominal pains  Swelling of the abdomen that is new, acute  Fever of 100F or higher   For urgent or emergent issues, a gastroenterologist can be reached at any hour by calling [phone]. Do not use MyChart messaging for urgent concerns.    DIET:  We do recommend a small meal at first, but then you may proceed to your regular diet.  Drink plenty of fluids but you should avoid  alcoholic beverages for 24 hours.  ACTIVITY:  You should plan to take it easy for the rest of today and you should NOT DRIVE or use heavy machinery until tomorrow (because of the sedation medicines used during the test).    FOLLOW UP: Our staff will call the number listed on your records the next business day following your procedure.  We will call around 7:15- 8:00 am to check on you and address any questions or concerns that you may have regarding the information given to you following your procedure. If we do not reach you, we will leave a message.  If you develop any symptoms (ie: fever, flu-like symptoms, shortness of breath, cough etc.) before then, please call [phone].  If you test positive for Covid 19 in the 2 weeks post procedure, please call and report this information to [place].    If any biopsies were taken you will be contacted by phone or by letter within the next 1-3 weeks.  Please call us at [phone] if you have not heard about the biopsies in 3 weeks.    SIGNATURES/CONFIDENTIALITY: You and/or your care partner have signed paperwork which will be entered into your electronic medical record.  These signatures attest to the fact that that the information above on your After Visit Summary has been reviewed and is understood.  Full responsibility of the confidentiality of this discharge information lies with you and/or your care-partner.

## 2022-08-20 NOTE — Op Note (Signed)
[place] Patient Name: Debra Murphy Procedure Date: 08/20/2022 8:30 AM MRN: [id number] Endoscopist: Gerrit Heck , MD Age: 66 Referring MD:  Date of Birth: 1956/08/10 Gender: Female Account #: [id number] Procedure:                Colonoscopy Indications:              Screening for colorectal malignant neoplasm (last                            colonoscopy was 10 years ago)                           Last Colonoscopy was 05/2012 and normal, no polyps. Medicines:                Monitored Anesthesia Care Procedure:                Pre-Anesthesia Assessment:                           - Prior to the procedure, a History and Physical                            was performed, and patient medications and                            allergies were reviewed. The patient's tolerance of                            previous anesthesia was also reviewed. The risks                            and benefits of the procedure and the sedation                            options and risks were discussed with the patient.                            All questions were answered, and informed consent                            was obtained. Prior Anticoagulants: The patient has                            taken no previous anticoagulant or antiplatelet                            agents. ASA Grade Assessment: II - A patient with                            mild systemic disease. After reviewing the risks                            and benefits, the patient was deemed in  satisfactory condition to undergo the procedure.                           After obtaining informed consent, the colonoscope                            was passed under direct vision. Throughout the                            procedure, the patient's blood pressure, pulse, and                            oxygen saturations were monitored continuously. The                            PCF-HQ190L Colonoscope was  introduced through the                            anus and advanced to the the terminal ileum. The                            colonoscopy was performed without difficulty. The                            patient tolerated the procedure well. The quality                            of the bowel preparation was good. The terminal                            ileum, ileocecal valve, appendiceal orifice, and                            rectum were photographed. Scope In: 8:32:55 AM Scope Out: 8:49:28 AM Scope Withdrawal Time: 0 hours 12 minutes 15 seconds  Total Procedure Duration: 0 hours 16 minutes 33 seconds  Findings:                 The perianal and digital rectal examinations were                            normal.                           A few small-mouthed diverticula were found in the                            ascending colon.                           A 3 mm polyp was found in the distal rectum. The                            polyp was multi-lobulated. The polyp was removed  with a cold snare. Resection and retrieval were                            complete. Estimated blood loss was minimal.                           Anal papilla(e) were hypertrophied.                           The terminal ileum appeared normal. Complications:            No immediate complications. Estimated Blood Loss:     Estimated blood loss was minimal. Impression:               - Diverticulosis in the ascending colon.                           - One 3 mm polyp in the distal rectum, removed with                            a cold snare. Resected and retrieved.                           - Anal papilla(e) were hypertrophied.                           - The examined portion of the ileum was normal. Recommendation:           - Patient has a contact number available for                            emergencies. The signs and symptoms of potential                            delayed  complications were discussed with the                            patient. Return to normal activities tomorrow.                            Written discharge instructions were provided to the                            patient.                           - Resume previous diet.                           - Continue present medications.                           - Resume Eliquis (apixaban) at prior dose tomorrow.                           - Await pathology results.                           -  Repeat colonoscopy in 5-10 years for surveillance                            based on pathology results.                           - Return to GI clinic PRN. Gerrit Heck, MD 08/20/2022 8:54:28 AM

## 2022-08-20 NOTE — Progress Notes (Signed)
Called to room to assist during endoscopic procedure.  Patient ID and intended procedure confirmed with present staff. Received instructions for my participation in the procedure from the performing physician.  

## 2022-08-20 NOTE — Progress Notes (Signed)
Report given to PACU, vss 

## 2022-08-21 ENCOUNTER — Telehealth: Payer: Self-pay

## 2022-08-21 NOTE — Telephone Encounter (Signed)
Left message on answering machine. 

## 2022-08-30 ENCOUNTER — Encounter: Payer: Self-pay | Admitting: Gastroenterology

## 2025-01-19 ENCOUNTER — Other Ambulatory Visit: Payer: Self-pay | Admitting: Medical Genetics
